# Patient Record
Sex: Female | Born: 1940
Health system: Southern US, Community
[De-identification: ages and names within clinical notes are randomized; demographics above are authoritative.]

## PROBLEM LIST (undated history)

## (undated) DIAGNOSIS — F319 Bipolar disorder, unspecified: Secondary | ICD-10-CM

## (undated) DIAGNOSIS — F0390 Unspecified dementia without behavioral disturbance: Secondary | ICD-10-CM

## (undated) DIAGNOSIS — E669 Obesity, unspecified: Secondary | ICD-10-CM

## (undated) HISTORY — DX: Unspecified dementia, unspecified severity, without behavioral disturbance, psychotic disturbance, mood disturbance, and anxiety: F03.90

## (undated) HISTORY — DX: Obesity, unspecified: E66.9

## (undated) HISTORY — PX: TOTAL HIP ARTHROPLASTY: SHX124

## (undated) HISTORY — DX: Bipolar disorder, unspecified: F31.9

## (undated) HISTORY — PX: OTHER SURGICAL HISTORY: SHX169

---

## 2001-02-01 ENCOUNTER — Encounter: Payer: Self-pay | Admitting: Obstetrics and Gynecology

## 2001-02-07 ENCOUNTER — Inpatient Hospital Stay (HOSPITAL_COMMUNITY): Admission: RE | Admit: 2001-02-07 | Discharge: 2001-02-09 | Payer: Self-pay | Admitting: Obstetrics and Gynecology

## 2014-10-08 DIAGNOSIS — N390 Urinary tract infection, site not specified: Secondary | ICD-10-CM | POA: Diagnosis not present

## 2014-10-12 DIAGNOSIS — N390 Urinary tract infection, site not specified: Secondary | ICD-10-CM | POA: Diagnosis not present

## 2014-10-30 DIAGNOSIS — K219 Gastro-esophageal reflux disease without esophagitis: Secondary | ICD-10-CM | POA: Diagnosis not present

## 2014-10-30 DIAGNOSIS — N189 Chronic kidney disease, unspecified: Secondary | ICD-10-CM | POA: Diagnosis not present

## 2014-10-30 DIAGNOSIS — E538 Deficiency of other specified B group vitamins: Secondary | ICD-10-CM | POA: Diagnosis not present

## 2014-10-30 DIAGNOSIS — I10 Essential (primary) hypertension: Secondary | ICD-10-CM | POA: Diagnosis not present

## 2014-10-30 DIAGNOSIS — Z7901 Long term (current) use of anticoagulants: Secondary | ICD-10-CM | POA: Diagnosis not present

## 2014-10-30 DIAGNOSIS — E785 Hyperlipidemia, unspecified: Secondary | ICD-10-CM | POA: Diagnosis not present

## 2014-11-06 DIAGNOSIS — N390 Urinary tract infection, site not specified: Secondary | ICD-10-CM | POA: Diagnosis not present

## 2014-11-08 DIAGNOSIS — N390 Urinary tract infection, site not specified: Secondary | ICD-10-CM | POA: Diagnosis not present

## 2014-11-12 DIAGNOSIS — I38 Endocarditis, valve unspecified: Secondary | ICD-10-CM | POA: Diagnosis not present

## 2014-11-12 DIAGNOSIS — F419 Anxiety disorder, unspecified: Secondary | ICD-10-CM | POA: Diagnosis not present

## 2014-11-12 DIAGNOSIS — I10 Essential (primary) hypertension: Secondary | ICD-10-CM | POA: Diagnosis not present

## 2014-11-12 DIAGNOSIS — N179 Acute kidney failure, unspecified: Secondary | ICD-10-CM | POA: Diagnosis not present

## 2014-11-12 DIAGNOSIS — G92 Toxic encephalopathy: Secondary | ICD-10-CM | POA: Diagnosis not present

## 2014-11-12 DIAGNOSIS — E780 Pure hypercholesterolemia: Secondary | ICD-10-CM | POA: Diagnosis not present

## 2014-11-12 DIAGNOSIS — E860 Dehydration: Secondary | ICD-10-CM | POA: Diagnosis not present

## 2014-11-12 DIAGNOSIS — R32 Unspecified urinary incontinence: Secondary | ICD-10-CM | POA: Diagnosis not present

## 2014-11-12 DIAGNOSIS — Z90710 Acquired absence of both cervix and uterus: Secondary | ICD-10-CM | POA: Diagnosis not present

## 2014-11-12 DIAGNOSIS — R4702 Dysphasia: Secondary | ICD-10-CM | POA: Diagnosis not present

## 2014-11-12 DIAGNOSIS — Z8701 Personal history of pneumonia (recurrent): Secondary | ICD-10-CM | POA: Diagnosis not present

## 2014-11-12 DIAGNOSIS — R1010 Upper abdominal pain, unspecified: Secondary | ICD-10-CM | POA: Diagnosis not present

## 2014-11-12 DIAGNOSIS — R05 Cough: Secondary | ICD-10-CM | POA: Diagnosis not present

## 2014-11-12 DIAGNOSIS — J90 Pleural effusion, not elsewhere classified: Secondary | ICD-10-CM | POA: Diagnosis not present

## 2014-11-12 DIAGNOSIS — F319 Bipolar disorder, unspecified: Secondary | ICD-10-CM | POA: Diagnosis not present

## 2014-11-12 DIAGNOSIS — G309 Alzheimer's disease, unspecified: Secondary | ICD-10-CM | POA: Diagnosis not present

## 2014-11-12 DIAGNOSIS — R270 Ataxia, unspecified: Secondary | ICD-10-CM | POA: Diagnosis not present

## 2014-11-12 DIAGNOSIS — N261 Atrophy of kidney (terminal): Secondary | ICD-10-CM | POA: Diagnosis not present

## 2014-11-12 DIAGNOSIS — R112 Nausea with vomiting, unspecified: Secondary | ICD-10-CM | POA: Diagnosis not present

## 2014-11-26 DIAGNOSIS — R110 Nausea: Secondary | ICD-10-CM | POA: Diagnosis not present

## 2014-12-10 DIAGNOSIS — I129 Hypertensive chronic kidney disease with stage 1 through stage 4 chronic kidney disease, or unspecified chronic kidney disease: Secondary | ICD-10-CM | POA: Diagnosis not present

## 2014-12-10 DIAGNOSIS — N184 Chronic kidney disease, stage 4 (severe): Secondary | ICD-10-CM | POA: Diagnosis not present

## 2014-12-10 DIAGNOSIS — R32 Unspecified urinary incontinence: Secondary | ICD-10-CM | POA: Diagnosis not present

## 2014-12-25 DIAGNOSIS — N189 Chronic kidney disease, unspecified: Secondary | ICD-10-CM | POA: Diagnosis not present

## 2014-12-25 DIAGNOSIS — G309 Alzheimer's disease, unspecified: Secondary | ICD-10-CM | POA: Diagnosis not present

## 2014-12-25 DIAGNOSIS — I10 Essential (primary) hypertension: Secondary | ICD-10-CM | POA: Diagnosis not present

## 2015-01-22 DIAGNOSIS — N189 Chronic kidney disease, unspecified: Secondary | ICD-10-CM | POA: Diagnosis not present

## 2015-01-22 DIAGNOSIS — G309 Alzheimer's disease, unspecified: Secondary | ICD-10-CM | POA: Diagnosis not present

## 2015-01-22 DIAGNOSIS — I10 Essential (primary) hypertension: Secondary | ICD-10-CM | POA: Diagnosis not present

## 2015-02-01 DIAGNOSIS — H25813 Combined forms of age-related cataract, bilateral: Secondary | ICD-10-CM | POA: Diagnosis not present

## 2015-03-13 DIAGNOSIS — S50811A Abrasion of right forearm, initial encounter: Secondary | ICD-10-CM | POA: Diagnosis not present

## 2015-04-09 DIAGNOSIS — K219 Gastro-esophageal reflux disease without esophagitis: Secondary | ICD-10-CM | POA: Diagnosis not present

## 2015-06-19 DIAGNOSIS — Z23 Encounter for immunization: Secondary | ICD-10-CM | POA: Diagnosis not present

## 2015-06-19 DIAGNOSIS — N183 Chronic kidney disease, stage 3 (moderate): Secondary | ICD-10-CM | POA: Diagnosis not present

## 2015-06-19 DIAGNOSIS — H6122 Impacted cerumen, left ear: Secondary | ICD-10-CM | POA: Diagnosis not present

## 2015-06-19 DIAGNOSIS — J00 Acute nasopharyngitis [common cold]: Secondary | ICD-10-CM | POA: Diagnosis not present

## 2015-06-19 DIAGNOSIS — I10 Essential (primary) hypertension: Secondary | ICD-10-CM | POA: Diagnosis not present

## 2015-07-02 DIAGNOSIS — N318 Other neuromuscular dysfunction of bladder: Secondary | ICD-10-CM | POA: Diagnosis not present

## 2015-07-02 DIAGNOSIS — N3090 Cystitis, unspecified without hematuria: Secondary | ICD-10-CM | POA: Diagnosis not present

## 2015-07-02 DIAGNOSIS — N3000 Acute cystitis without hematuria: Secondary | ICD-10-CM | POA: Diagnosis not present

## 2015-07-02 DIAGNOSIS — R3981 Functional urinary incontinence: Secondary | ICD-10-CM | POA: Diagnosis not present

## 2015-07-23 DIAGNOSIS — N3020 Other chronic cystitis without hematuria: Secondary | ICD-10-CM | POA: Diagnosis not present

## 2015-07-23 DIAGNOSIS — R3981 Functional urinary incontinence: Secondary | ICD-10-CM | POA: Diagnosis not present

## 2015-07-23 DIAGNOSIS — N318 Other neuromuscular dysfunction of bladder: Secondary | ICD-10-CM | POA: Diagnosis not present

## 2015-07-28 DIAGNOSIS — J0100 Acute maxillary sinusitis, unspecified: Secondary | ICD-10-CM | POA: Diagnosis not present

## 2015-09-19 DIAGNOSIS — I10 Essential (primary) hypertension: Secondary | ICD-10-CM | POA: Diagnosis not present

## 2015-09-19 DIAGNOSIS — J00 Acute nasopharyngitis [common cold]: Secondary | ICD-10-CM | POA: Diagnosis not present

## 2015-09-19 DIAGNOSIS — H109 Unspecified conjunctivitis: Secondary | ICD-10-CM | POA: Diagnosis not present

## 2015-09-21 DIAGNOSIS — J189 Pneumonia, unspecified organism: Secondary | ICD-10-CM | POA: Diagnosis not present

## 2015-10-08 DIAGNOSIS — M25552 Pain in left hip: Secondary | ICD-10-CM | POA: Diagnosis not present

## 2015-10-08 DIAGNOSIS — N183 Chronic kidney disease, stage 3 (moderate): Secondary | ICD-10-CM | POA: Diagnosis not present

## 2015-10-08 DIAGNOSIS — M545 Low back pain: Secondary | ICD-10-CM | POA: Diagnosis not present

## 2015-10-08 DIAGNOSIS — M1990 Unspecified osteoarthritis, unspecified site: Secondary | ICD-10-CM | POA: Diagnosis not present

## 2015-10-08 DIAGNOSIS — M25551 Pain in right hip: Secondary | ICD-10-CM | POA: Diagnosis not present

## 2015-10-08 DIAGNOSIS — I10 Essential (primary) hypertension: Secondary | ICD-10-CM | POA: Diagnosis not present

## 2015-10-25 DIAGNOSIS — I5189 Other ill-defined heart diseases: Secondary | ICD-10-CM | POA: Diagnosis not present

## 2015-10-25 DIAGNOSIS — I200 Unstable angina: Secondary | ICD-10-CM | POA: Diagnosis not present

## 2015-10-25 DIAGNOSIS — N179 Acute kidney failure, unspecified: Secondary | ICD-10-CM | POA: Diagnosis not present

## 2015-10-25 DIAGNOSIS — R9431 Abnormal electrocardiogram [ECG] [EKG]: Secondary | ICD-10-CM | POA: Diagnosis not present

## 2015-10-25 DIAGNOSIS — R0602 Shortness of breath: Secondary | ICD-10-CM | POA: Diagnosis not present

## 2015-10-25 DIAGNOSIS — E7800 Pure hypercholesterolemia, unspecified: Secondary | ICD-10-CM | POA: Diagnosis not present

## 2015-10-25 DIAGNOSIS — I129 Hypertensive chronic kidney disease with stage 1 through stage 4 chronic kidney disease, or unspecified chronic kidney disease: Secondary | ICD-10-CM | POA: Diagnosis not present

## 2015-10-25 DIAGNOSIS — N184 Chronic kidney disease, stage 4 (severe): Secondary | ICD-10-CM | POA: Diagnosis not present

## 2015-10-25 DIAGNOSIS — Z79899 Other long term (current) drug therapy: Secondary | ICD-10-CM | POA: Diagnosis not present

## 2015-10-25 DIAGNOSIS — G309 Alzheimer's disease, unspecified: Secondary | ICD-10-CM | POA: Diagnosis not present

## 2015-10-25 DIAGNOSIS — R079 Chest pain, unspecified: Secondary | ICD-10-CM | POA: Diagnosis not present

## 2015-10-25 DIAGNOSIS — R51 Headache: Secondary | ICD-10-CM | POA: Diagnosis not present

## 2015-10-25 DIAGNOSIS — G92 Toxic encephalopathy: Secondary | ICD-10-CM | POA: Diagnosis not present

## 2015-10-25 DIAGNOSIS — I10 Essential (primary) hypertension: Secondary | ICD-10-CM | POA: Diagnosis not present

## 2015-10-25 DIAGNOSIS — K219 Gastro-esophageal reflux disease without esophagitis: Secondary | ICD-10-CM | POA: Diagnosis not present

## 2015-10-31 DIAGNOSIS — N2889 Other specified disorders of kidney and ureter: Secondary | ICD-10-CM | POA: Diagnosis not present

## 2015-10-31 DIAGNOSIS — D72829 Elevated white blood cell count, unspecified: Secondary | ICD-10-CM | POA: Diagnosis not present

## 2015-10-31 DIAGNOSIS — R112 Nausea with vomiting, unspecified: Secondary | ICD-10-CM | POA: Diagnosis not present

## 2015-11-03 DIAGNOSIS — N390 Urinary tract infection, site not specified: Secondary | ICD-10-CM | POA: Diagnosis not present

## 2015-11-03 DIAGNOSIS — R279 Unspecified lack of coordination: Secondary | ICD-10-CM | POA: Diagnosis not present

## 2015-11-03 DIAGNOSIS — E872 Acidosis: Secondary | ICD-10-CM | POA: Diagnosis not present

## 2015-11-03 DIAGNOSIS — N179 Acute kidney failure, unspecified: Secondary | ICD-10-CM | POA: Diagnosis not present

## 2015-11-03 DIAGNOSIS — R1312 Dysphagia, oropharyngeal phase: Secondary | ICD-10-CM | POA: Diagnosis not present

## 2015-11-03 DIAGNOSIS — I10 Essential (primary) hypertension: Secondary | ICD-10-CM | POA: Diagnosis not present

## 2015-11-03 DIAGNOSIS — K264 Chronic or unspecified duodenal ulcer with hemorrhage: Secondary | ICD-10-CM | POA: Diagnosis not present

## 2015-11-03 DIAGNOSIS — K219 Gastro-esophageal reflux disease without esophagitis: Secondary | ICD-10-CM | POA: Diagnosis not present

## 2015-11-03 DIAGNOSIS — R1110 Vomiting, unspecified: Secondary | ICD-10-CM | POA: Diagnosis not present

## 2015-11-03 DIAGNOSIS — R633 Feeding difficulties: Secondary | ICD-10-CM | POA: Diagnosis not present

## 2015-11-03 DIAGNOSIS — B952 Enterococcus as the cause of diseases classified elsewhere: Secondary | ICD-10-CM | POA: Diagnosis not present

## 2015-11-03 DIAGNOSIS — M1990 Unspecified osteoarthritis, unspecified site: Secondary | ICD-10-CM | POA: Diagnosis not present

## 2015-11-03 DIAGNOSIS — R0602 Shortness of breath: Secondary | ICD-10-CM | POA: Diagnosis not present

## 2015-11-03 DIAGNOSIS — Z1624 Resistance to multiple antibiotics: Secondary | ICD-10-CM | POA: Diagnosis not present

## 2015-11-03 DIAGNOSIS — I38 Endocarditis, valve unspecified: Secondary | ICD-10-CM | POA: Diagnosis not present

## 2015-11-03 DIAGNOSIS — G9341 Metabolic encephalopathy: Secondary | ICD-10-CM | POA: Diagnosis not present

## 2015-11-03 DIAGNOSIS — K922 Gastrointestinal hemorrhage, unspecified: Secondary | ICD-10-CM | POA: Diagnosis not present

## 2015-11-03 DIAGNOSIS — R262 Difficulty in walking, not elsewhere classified: Secondary | ICD-10-CM | POA: Diagnosis not present

## 2015-11-03 DIAGNOSIS — N189 Chronic kidney disease, unspecified: Secondary | ICD-10-CM | POA: Diagnosis not present

## 2015-11-03 DIAGNOSIS — T8119XA Other postprocedural shock, initial encounter: Secondary | ICD-10-CM | POA: Diagnosis not present

## 2015-11-03 DIAGNOSIS — D62 Acute posthemorrhagic anemia: Secondary | ICD-10-CM | POA: Diagnosis not present

## 2015-11-03 DIAGNOSIS — K260 Acute duodenal ulcer with hemorrhage: Secondary | ICD-10-CM | POA: Diagnosis not present

## 2015-11-09 DIAGNOSIS — I10 Essential (primary) hypertension: Secondary | ICD-10-CM | POA: Diagnosis not present

## 2015-11-09 DIAGNOSIS — R633 Feeding difficulties: Secondary | ICD-10-CM | POA: Diagnosis not present

## 2015-11-09 DIAGNOSIS — I38 Endocarditis, valve unspecified: Secondary | ICD-10-CM | POA: Diagnosis not present

## 2015-11-09 DIAGNOSIS — N390 Urinary tract infection, site not specified: Secondary | ICD-10-CM | POA: Diagnosis not present

## 2015-11-09 DIAGNOSIS — R1312 Dysphagia, oropharyngeal phase: Secondary | ICD-10-CM | POA: Diagnosis not present

## 2015-11-09 DIAGNOSIS — B952 Enterococcus as the cause of diseases classified elsewhere: Secondary | ICD-10-CM | POA: Diagnosis not present

## 2015-11-09 DIAGNOSIS — Z1624 Resistance to multiple antibiotics: Secondary | ICD-10-CM | POA: Diagnosis not present

## 2015-11-09 DIAGNOSIS — K264 Chronic or unspecified duodenal ulcer with hemorrhage: Secondary | ICD-10-CM | POA: Diagnosis not present

## 2015-11-09 DIAGNOSIS — N179 Acute kidney failure, unspecified: Secondary | ICD-10-CM | POA: Diagnosis not present

## 2015-11-09 DIAGNOSIS — R279 Unspecified lack of coordination: Secondary | ICD-10-CM | POA: Diagnosis not present

## 2015-11-09 DIAGNOSIS — D62 Acute posthemorrhagic anemia: Secondary | ICD-10-CM | POA: Diagnosis not present

## 2015-11-09 DIAGNOSIS — M1990 Unspecified osteoarthritis, unspecified site: Secondary | ICD-10-CM | POA: Diagnosis not present

## 2015-11-09 DIAGNOSIS — K219 Gastro-esophageal reflux disease without esophagitis: Secondary | ICD-10-CM | POA: Diagnosis not present

## 2015-11-09 DIAGNOSIS — R262 Difficulty in walking, not elsewhere classified: Secondary | ICD-10-CM | POA: Diagnosis not present

## 2015-11-09 DIAGNOSIS — N189 Chronic kidney disease, unspecified: Secondary | ICD-10-CM | POA: Diagnosis not present

## 2015-11-09 DIAGNOSIS — K922 Gastrointestinal hemorrhage, unspecified: Secondary | ICD-10-CM | POA: Diagnosis not present

## 2015-11-12 DIAGNOSIS — D62 Acute posthemorrhagic anemia: Secondary | ICD-10-CM | POA: Diagnosis not present

## 2015-11-12 DIAGNOSIS — N390 Urinary tract infection, site not specified: Secondary | ICD-10-CM | POA: Diagnosis not present

## 2015-11-17 DIAGNOSIS — I10 Essential (primary) hypertension: Secondary | ICD-10-CM | POA: Diagnosis not present

## 2015-11-17 DIAGNOSIS — K648 Other hemorrhoids: Secondary | ICD-10-CM | POA: Diagnosis not present

## 2015-11-17 DIAGNOSIS — K922 Gastrointestinal hemorrhage, unspecified: Secondary | ICD-10-CM | POA: Diagnosis not present

## 2015-11-17 DIAGNOSIS — D62 Acute posthemorrhagic anemia: Secondary | ICD-10-CM | POA: Diagnosis not present

## 2015-11-17 DIAGNOSIS — N179 Acute kidney failure, unspecified: Secondary | ICD-10-CM | POA: Diagnosis not present

## 2015-11-17 DIAGNOSIS — K264 Chronic or unspecified duodenal ulcer with hemorrhage: Secondary | ICD-10-CM | POA: Diagnosis not present

## 2015-11-22 DIAGNOSIS — K264 Chronic or unspecified duodenal ulcer with hemorrhage: Secondary | ICD-10-CM | POA: Diagnosis not present

## 2015-11-25 DIAGNOSIS — R404 Transient alteration of awareness: Secondary | ICD-10-CM | POA: Diagnosis not present

## 2015-11-25 DIAGNOSIS — R4182 Altered mental status, unspecified: Secondary | ICD-10-CM | POA: Diagnosis not present

## 2015-11-25 DIAGNOSIS — R531 Weakness: Secondary | ICD-10-CM | POA: Diagnosis not present

## 2015-11-26 DIAGNOSIS — N183 Chronic kidney disease, stage 3 (moderate): Secondary | ICD-10-CM | POA: Diagnosis not present

## 2015-11-26 DIAGNOSIS — R109 Unspecified abdominal pain: Secondary | ICD-10-CM | POA: Diagnosis not present

## 2015-11-26 DIAGNOSIS — I10 Essential (primary) hypertension: Secondary | ICD-10-CM | POA: Diagnosis not present

## 2015-11-26 DIAGNOSIS — D649 Anemia, unspecified: Secondary | ICD-10-CM | POA: Diagnosis not present

## 2015-11-27 DIAGNOSIS — K264 Chronic or unspecified duodenal ulcer with hemorrhage: Secondary | ICD-10-CM | POA: Diagnosis not present

## 2015-11-28 DIAGNOSIS — K264 Chronic or unspecified duodenal ulcer with hemorrhage: Secondary | ICD-10-CM | POA: Diagnosis not present

## 2015-12-03 DIAGNOSIS — K264 Chronic or unspecified duodenal ulcer with hemorrhage: Secondary | ICD-10-CM | POA: Diagnosis not present

## 2015-12-04 DIAGNOSIS — K264 Chronic or unspecified duodenal ulcer with hemorrhage: Secondary | ICD-10-CM | POA: Diagnosis not present

## 2015-12-05 DIAGNOSIS — K264 Chronic or unspecified duodenal ulcer with hemorrhage: Secondary | ICD-10-CM | POA: Diagnosis not present

## 2015-12-09 DIAGNOSIS — K264 Chronic or unspecified duodenal ulcer with hemorrhage: Secondary | ICD-10-CM | POA: Diagnosis not present

## 2015-12-11 DIAGNOSIS — K264 Chronic or unspecified duodenal ulcer with hemorrhage: Secondary | ICD-10-CM | POA: Diagnosis not present

## 2015-12-12 DIAGNOSIS — K264 Chronic or unspecified duodenal ulcer with hemorrhage: Secondary | ICD-10-CM | POA: Diagnosis not present

## 2015-12-17 DIAGNOSIS — K264 Chronic or unspecified duodenal ulcer with hemorrhage: Secondary | ICD-10-CM | POA: Diagnosis not present

## 2015-12-18 DIAGNOSIS — K264 Chronic or unspecified duodenal ulcer with hemorrhage: Secondary | ICD-10-CM | POA: Diagnosis not present

## 2015-12-19 DIAGNOSIS — E119 Type 2 diabetes mellitus without complications: Secondary | ICD-10-CM | POA: Diagnosis not present

## 2015-12-19 DIAGNOSIS — E559 Vitamin D deficiency, unspecified: Secondary | ICD-10-CM | POA: Diagnosis not present

## 2015-12-19 DIAGNOSIS — R5383 Other fatigue: Secondary | ICD-10-CM | POA: Diagnosis not present

## 2015-12-19 DIAGNOSIS — R799 Abnormal finding of blood chemistry, unspecified: Secondary | ICD-10-CM | POA: Diagnosis not present

## 2015-12-19 DIAGNOSIS — I10 Essential (primary) hypertension: Secondary | ICD-10-CM | POA: Diagnosis not present

## 2015-12-19 DIAGNOSIS — E785 Hyperlipidemia, unspecified: Secondary | ICD-10-CM | POA: Diagnosis not present

## 2015-12-19 DIAGNOSIS — N183 Chronic kidney disease, stage 3 (moderate): Secondary | ICD-10-CM | POA: Diagnosis not present

## 2015-12-19 DIAGNOSIS — Z0000 Encounter for general adult medical examination without abnormal findings: Secondary | ICD-10-CM | POA: Diagnosis not present

## 2015-12-26 DIAGNOSIS — K264 Chronic or unspecified duodenal ulcer with hemorrhage: Secondary | ICD-10-CM | POA: Diagnosis not present

## 2015-12-31 DIAGNOSIS — N189 Chronic kidney disease, unspecified: Secondary | ICD-10-CM | POA: Diagnosis not present

## 2015-12-31 DIAGNOSIS — D649 Anemia, unspecified: Secondary | ICD-10-CM | POA: Diagnosis not present

## 2015-12-31 DIAGNOSIS — E860 Dehydration: Secondary | ICD-10-CM | POA: Diagnosis not present

## 2015-12-31 DIAGNOSIS — N390 Urinary tract infection, site not specified: Secondary | ICD-10-CM | POA: Diagnosis not present

## 2016-03-30 DIAGNOSIS — M79604 Pain in right leg: Secondary | ICD-10-CM | POA: Diagnosis not present

## 2016-04-06 DIAGNOSIS — J209 Acute bronchitis, unspecified: Secondary | ICD-10-CM | POA: Diagnosis not present

## 2016-04-09 DIAGNOSIS — R062 Wheezing: Secondary | ICD-10-CM | POA: Diagnosis not present

## 2016-04-09 DIAGNOSIS — J209 Acute bronchitis, unspecified: Secondary | ICD-10-CM | POA: Diagnosis not present

## 2016-06-19 DIAGNOSIS — N390 Urinary tract infection, site not specified: Secondary | ICD-10-CM | POA: Diagnosis not present

## 2016-06-19 DIAGNOSIS — M545 Low back pain: Secondary | ICD-10-CM | POA: Diagnosis not present

## 2016-06-19 DIAGNOSIS — R109 Unspecified abdominal pain: Secondary | ICD-10-CM | POA: Diagnosis not present

## 2016-06-30 DIAGNOSIS — D649 Anemia, unspecified: Secondary | ICD-10-CM | POA: Diagnosis not present

## 2016-07-24 DIAGNOSIS — E889 Metabolic disorder, unspecified: Secondary | ICD-10-CM | POA: Diagnosis not present

## 2016-07-24 DIAGNOSIS — N119 Chronic tubulo-interstitial nephritis, unspecified: Secondary | ICD-10-CM | POA: Diagnosis not present

## 2016-07-24 DIAGNOSIS — E559 Vitamin D deficiency, unspecified: Secondary | ICD-10-CM | POA: Diagnosis not present

## 2016-07-24 DIAGNOSIS — N184 Chronic kidney disease, stage 4 (severe): Secondary | ICD-10-CM | POA: Diagnosis not present

## 2016-08-18 DIAGNOSIS — R05 Cough: Secondary | ICD-10-CM | POA: Diagnosis not present

## 2016-10-12 DIAGNOSIS — N289 Disorder of kidney and ureter, unspecified: Secondary | ICD-10-CM | POA: Diagnosis not present

## 2016-10-12 DIAGNOSIS — G9341 Metabolic encephalopathy: Secondary | ICD-10-CM | POA: Diagnosis not present

## 2016-10-12 DIAGNOSIS — R531 Weakness: Secondary | ICD-10-CM | POA: Diagnosis not present

## 2016-10-12 DIAGNOSIS — R404 Transient alteration of awareness: Secondary | ICD-10-CM | POA: Diagnosis not present

## 2016-10-12 DIAGNOSIS — A419 Sepsis, unspecified organism: Secondary | ICD-10-CM | POA: Diagnosis not present

## 2016-10-13 DIAGNOSIS — G9341 Metabolic encephalopathy: Secondary | ICD-10-CM | POA: Diagnosis not present

## 2016-10-13 DIAGNOSIS — J111 Influenza due to unidentified influenza virus with other respiratory manifestations: Secondary | ICD-10-CM | POA: Diagnosis not present

## 2016-10-13 DIAGNOSIS — N179 Acute kidney failure, unspecified: Secondary | ICD-10-CM | POA: Diagnosis not present

## 2016-10-13 DIAGNOSIS — F0390 Unspecified dementia without behavioral disturbance: Secondary | ICD-10-CM | POA: Diagnosis not present

## 2016-10-14 DIAGNOSIS — N179 Acute kidney failure, unspecified: Secondary | ICD-10-CM | POA: Diagnosis not present

## 2016-10-14 DIAGNOSIS — G9341 Metabolic encephalopathy: Secondary | ICD-10-CM | POA: Diagnosis not present

## 2016-10-14 DIAGNOSIS — J111 Influenza due to unidentified influenza virus with other respiratory manifestations: Secondary | ICD-10-CM | POA: Diagnosis not present

## 2016-10-14 DIAGNOSIS — F0390 Unspecified dementia without behavioral disturbance: Secondary | ICD-10-CM | POA: Diagnosis not present

## 2016-10-16 DIAGNOSIS — R05 Cough: Secondary | ICD-10-CM | POA: Diagnosis not present

## 2016-10-19 DIAGNOSIS — N183 Chronic kidney disease, stage 3 (moderate): Secondary | ICD-10-CM | POA: Diagnosis not present

## 2016-10-19 DIAGNOSIS — I129 Hypertensive chronic kidney disease with stage 1 through stage 4 chronic kidney disease, or unspecified chronic kidney disease: Secondary | ICD-10-CM | POA: Diagnosis not present

## 2016-10-19 DIAGNOSIS — Z87440 Personal history of urinary (tract) infections: Secondary | ICD-10-CM | POA: Diagnosis not present

## 2016-10-19 DIAGNOSIS — K264 Chronic or unspecified duodenal ulcer with hemorrhage: Secondary | ICD-10-CM | POA: Diagnosis not present

## 2016-10-19 DIAGNOSIS — I69391 Dysphagia following cerebral infarction: Secondary | ICD-10-CM | POA: Diagnosis not present

## 2016-10-19 DIAGNOSIS — M1990 Unspecified osteoarthritis, unspecified site: Secondary | ICD-10-CM | POA: Diagnosis not present

## 2016-10-19 DIAGNOSIS — N179 Acute kidney failure, unspecified: Secondary | ICD-10-CM | POA: Diagnosis not present

## 2016-10-19 DIAGNOSIS — R1310 Dysphagia, unspecified: Secondary | ICD-10-CM | POA: Diagnosis not present

## 2016-10-19 DIAGNOSIS — I10 Essential (primary) hypertension: Secondary | ICD-10-CM | POA: Diagnosis not present

## 2016-10-19 DIAGNOSIS — K219 Gastro-esophageal reflux disease without esophagitis: Secondary | ICD-10-CM | POA: Diagnosis not present

## 2016-10-19 DIAGNOSIS — D649 Anemia, unspecified: Secondary | ICD-10-CM | POA: Diagnosis not present

## 2016-10-19 DIAGNOSIS — K922 Gastrointestinal hemorrhage, unspecified: Secondary | ICD-10-CM | POA: Diagnosis not present

## 2016-10-20 DIAGNOSIS — R05 Cough: Secondary | ICD-10-CM | POA: Diagnosis not present

## 2016-10-21 DIAGNOSIS — I69391 Dysphagia following cerebral infarction: Secondary | ICD-10-CM | POA: Diagnosis not present

## 2016-10-21 DIAGNOSIS — K922 Gastrointestinal hemorrhage, unspecified: Secondary | ICD-10-CM | POA: Diagnosis not present

## 2016-10-21 DIAGNOSIS — D649 Anemia, unspecified: Secondary | ICD-10-CM | POA: Diagnosis not present

## 2016-10-21 DIAGNOSIS — Z87440 Personal history of urinary (tract) infections: Secondary | ICD-10-CM | POA: Diagnosis not present

## 2016-10-21 DIAGNOSIS — I10 Essential (primary) hypertension: Secondary | ICD-10-CM | POA: Diagnosis not present

## 2016-10-21 DIAGNOSIS — R1310 Dysphagia, unspecified: Secondary | ICD-10-CM | POA: Diagnosis not present

## 2016-10-21 DIAGNOSIS — M1990 Unspecified osteoarthritis, unspecified site: Secondary | ICD-10-CM | POA: Diagnosis not present

## 2016-10-21 DIAGNOSIS — N179 Acute kidney failure, unspecified: Secondary | ICD-10-CM | POA: Diagnosis not present

## 2016-10-21 DIAGNOSIS — K219 Gastro-esophageal reflux disease without esophagitis: Secondary | ICD-10-CM | POA: Diagnosis not present

## 2016-10-21 DIAGNOSIS — I129 Hypertensive chronic kidney disease with stage 1 through stage 4 chronic kidney disease, or unspecified chronic kidney disease: Secondary | ICD-10-CM | POA: Diagnosis not present

## 2016-10-21 DIAGNOSIS — K264 Chronic or unspecified duodenal ulcer with hemorrhage: Secondary | ICD-10-CM | POA: Diagnosis not present

## 2016-10-21 DIAGNOSIS — N183 Chronic kidney disease, stage 3 (moderate): Secondary | ICD-10-CM | POA: Diagnosis not present

## 2016-11-23 DIAGNOSIS — N119 Chronic tubulo-interstitial nephritis, unspecified: Secondary | ICD-10-CM | POA: Diagnosis not present

## 2016-11-23 DIAGNOSIS — M9080 Osteopathy in diseases classified elsewhere, unspecified site: Secondary | ICD-10-CM | POA: Diagnosis not present

## 2016-11-23 DIAGNOSIS — E559 Vitamin D deficiency, unspecified: Secondary | ICD-10-CM | POA: Diagnosis not present

## 2016-11-23 DIAGNOSIS — N184 Chronic kidney disease, stage 4 (severe): Secondary | ICD-10-CM | POA: Diagnosis not present

## 2016-11-23 DIAGNOSIS — E889 Metabolic disorder, unspecified: Secondary | ICD-10-CM | POA: Diagnosis not present

## 2017-02-11 DIAGNOSIS — G309 Alzheimer's disease, unspecified: Secondary | ICD-10-CM | POA: Diagnosis not present

## 2017-02-11 DIAGNOSIS — I129 Hypertensive chronic kidney disease with stage 1 through stage 4 chronic kidney disease, or unspecified chronic kidney disease: Secondary | ICD-10-CM | POA: Diagnosis not present

## 2017-02-11 DIAGNOSIS — N189 Chronic kidney disease, unspecified: Secondary | ICD-10-CM | POA: Diagnosis not present

## 2017-02-11 DIAGNOSIS — M25551 Pain in right hip: Secondary | ICD-10-CM | POA: Diagnosis not present

## 2017-02-11 DIAGNOSIS — M25552 Pain in left hip: Secondary | ICD-10-CM | POA: Diagnosis not present

## 2017-02-11 DIAGNOSIS — S3993XA Unspecified injury of pelvis, initial encounter: Secondary | ICD-10-CM | POA: Diagnosis not present

## 2017-02-16 DIAGNOSIS — M25569 Pain in unspecified knee: Secondary | ICD-10-CM | POA: Diagnosis not present

## 2017-02-16 DIAGNOSIS — I10 Essential (primary) hypertension: Secondary | ICD-10-CM | POA: Diagnosis not present

## 2017-08-10 DIAGNOSIS — I10 Essential (primary) hypertension: Secondary | ICD-10-CM | POA: Diagnosis not present

## 2017-08-11 DIAGNOSIS — A419 Sepsis, unspecified organism: Secondary | ICD-10-CM | POA: Diagnosis not present

## 2017-08-11 DIAGNOSIS — S92902A Unspecified fracture of left foot, initial encounter for closed fracture: Secondary | ICD-10-CM | POA: Diagnosis not present

## 2017-08-11 DIAGNOSIS — M79672 Pain in left foot: Secondary | ICD-10-CM | POA: Diagnosis not present

## 2017-08-12 DIAGNOSIS — A419 Sepsis, unspecified organism: Secondary | ICD-10-CM | POA: Diagnosis not present

## 2017-08-12 DIAGNOSIS — M79672 Pain in left foot: Secondary | ICD-10-CM | POA: Diagnosis not present

## 2017-08-12 DIAGNOSIS — S92902A Unspecified fracture of left foot, initial encounter for closed fracture: Secondary | ICD-10-CM | POA: Diagnosis not present

## 2017-08-13 DIAGNOSIS — R05 Cough: Secondary | ICD-10-CM | POA: Diagnosis not present

## 2017-08-17 DIAGNOSIS — G4700 Insomnia, unspecified: Secondary | ICD-10-CM | POA: Diagnosis not present

## 2017-08-19 DIAGNOSIS — M84375A Stress fracture, left foot, initial encounter for fracture: Secondary | ICD-10-CM | POA: Diagnosis not present

## 2017-08-24 DIAGNOSIS — I10 Essential (primary) hypertension: Secondary | ICD-10-CM | POA: Diagnosis not present

## 2017-08-24 DIAGNOSIS — R251 Tremor, unspecified: Secondary | ICD-10-CM | POA: Diagnosis not present

## 2017-08-26 DIAGNOSIS — I10 Essential (primary) hypertension: Secondary | ICD-10-CM | POA: Diagnosis not present

## 2017-08-26 DIAGNOSIS — E785 Hyperlipidemia, unspecified: Secondary | ICD-10-CM | POA: Diagnosis not present

## 2017-09-01 DIAGNOSIS — S79911A Unspecified injury of right hip, initial encounter: Secondary | ICD-10-CM | POA: Diagnosis not present

## 2017-09-01 DIAGNOSIS — N189 Chronic kidney disease, unspecified: Secondary | ICD-10-CM | POA: Diagnosis not present

## 2017-09-01 DIAGNOSIS — S72012A Unspecified intracapsular fracture of left femur, initial encounter for closed fracture: Secondary | ICD-10-CM | POA: Diagnosis not present

## 2017-09-01 DIAGNOSIS — S79922A Unspecified injury of left thigh, initial encounter: Secondary | ICD-10-CM | POA: Diagnosis not present

## 2017-09-01 DIAGNOSIS — M79605 Pain in left leg: Secondary | ICD-10-CM | POA: Diagnosis not present

## 2017-09-01 DIAGNOSIS — W19XXXA Unspecified fall, initial encounter: Secondary | ICD-10-CM | POA: Diagnosis not present

## 2017-09-01 DIAGNOSIS — F319 Bipolar disorder, unspecified: Secondary | ICD-10-CM | POA: Diagnosis not present

## 2017-09-01 DIAGNOSIS — T148XXA Other injury of unspecified body region, initial encounter: Secondary | ICD-10-CM | POA: Diagnosis not present

## 2017-09-01 DIAGNOSIS — F0390 Unspecified dementia without behavioral disturbance: Secondary | ICD-10-CM | POA: Diagnosis not present

## 2017-09-01 DIAGNOSIS — M25551 Pain in right hip: Secondary | ICD-10-CM | POA: Diagnosis not present

## 2017-09-01 DIAGNOSIS — S79912A Unspecified injury of left hip, initial encounter: Secondary | ICD-10-CM | POA: Diagnosis not present

## 2017-09-01 DIAGNOSIS — S299XXA Unspecified injury of thorax, initial encounter: Secondary | ICD-10-CM | POA: Diagnosis not present

## 2017-09-01 DIAGNOSIS — M25552 Pain in left hip: Secondary | ICD-10-CM | POA: Diagnosis not present

## 2017-09-02 DIAGNOSIS — K219 Gastro-esophageal reflux disease without esophagitis: Secondary | ICD-10-CM | POA: Diagnosis not present

## 2017-09-02 DIAGNOSIS — F0390 Unspecified dementia without behavioral disturbance: Secondary | ICD-10-CM | POA: Diagnosis not present

## 2017-09-02 DIAGNOSIS — S72002A Fracture of unspecified part of neck of left femur, initial encounter for closed fracture: Secondary | ICD-10-CM | POA: Diagnosis not present

## 2017-09-02 DIAGNOSIS — E870 Hyperosmolality and hypernatremia: Secondary | ICD-10-CM | POA: Diagnosis not present

## 2017-09-02 DIAGNOSIS — I129 Hypertensive chronic kidney disease with stage 1 through stage 4 chronic kidney disease, or unspecified chronic kidney disease: Secondary | ICD-10-CM | POA: Diagnosis not present

## 2017-09-02 DIAGNOSIS — S72042A Displaced fracture of base of neck of left femur, initial encounter for closed fracture: Secondary | ICD-10-CM | POA: Diagnosis not present

## 2017-09-02 DIAGNOSIS — S79911A Unspecified injury of right hip, initial encounter: Secondary | ICD-10-CM | POA: Diagnosis not present

## 2017-09-02 DIAGNOSIS — N179 Acute kidney failure, unspecified: Secondary | ICD-10-CM | POA: Diagnosis not present

## 2017-09-02 DIAGNOSIS — Z66 Do not resuscitate: Secondary | ICD-10-CM | POA: Diagnosis not present

## 2017-09-02 DIAGNOSIS — R1312 Dysphagia, oropharyngeal phase: Secondary | ICD-10-CM | POA: Diagnosis not present

## 2017-09-02 DIAGNOSIS — I10 Essential (primary) hypertension: Secondary | ICD-10-CM | POA: Diagnosis not present

## 2017-09-02 DIAGNOSIS — R130 Aphagia: Secondary | ICD-10-CM | POA: Diagnosis not present

## 2017-09-02 DIAGNOSIS — Z9181 History of falling: Secondary | ICD-10-CM | POA: Diagnosis not present

## 2017-09-02 DIAGNOSIS — S79912A Unspecified injury of left hip, initial encounter: Secondary | ICD-10-CM | POA: Diagnosis not present

## 2017-09-02 DIAGNOSIS — E7800 Pure hypercholesterolemia, unspecified: Secondary | ICD-10-CM | POA: Diagnosis not present

## 2017-09-02 DIAGNOSIS — N183 Chronic kidney disease, stage 3 (moderate): Secondary | ICD-10-CM | POA: Diagnosis not present

## 2017-09-02 DIAGNOSIS — R278 Other lack of coordination: Secondary | ICD-10-CM | POA: Diagnosis not present

## 2017-09-02 DIAGNOSIS — D631 Anemia in chronic kidney disease: Secondary | ICD-10-CM | POA: Diagnosis not present

## 2017-09-02 DIAGNOSIS — S72009A Fracture of unspecified part of neck of unspecified femur, initial encounter for closed fracture: Secondary | ICD-10-CM | POA: Diagnosis not present

## 2017-09-02 DIAGNOSIS — R2689 Other abnormalities of gait and mobility: Secondary | ICD-10-CM | POA: Diagnosis not present

## 2017-09-02 DIAGNOSIS — M25551 Pain in right hip: Secondary | ICD-10-CM | POA: Diagnosis not present

## 2017-09-02 DIAGNOSIS — F319 Bipolar disorder, unspecified: Secondary | ICD-10-CM | POA: Diagnosis not present

## 2017-09-02 DIAGNOSIS — Z79899 Other long term (current) drug therapy: Secondary | ICD-10-CM | POA: Diagnosis not present

## 2017-09-02 DIAGNOSIS — M25552 Pain in left hip: Secondary | ICD-10-CM | POA: Diagnosis not present

## 2017-09-02 DIAGNOSIS — R5383 Other fatigue: Secondary | ICD-10-CM | POA: Diagnosis not present

## 2017-09-02 DIAGNOSIS — W19XXXA Unspecified fall, initial encounter: Secondary | ICD-10-CM | POA: Diagnosis not present

## 2017-09-02 DIAGNOSIS — S72092A Other fracture of head and neck of left femur, initial encounter for closed fracture: Secondary | ICD-10-CM | POA: Diagnosis not present

## 2017-09-02 DIAGNOSIS — R2681 Unsteadiness on feet: Secondary | ICD-10-CM | POA: Diagnosis not present

## 2017-09-02 DIAGNOSIS — M6281 Muscle weakness (generalized): Secondary | ICD-10-CM | POA: Diagnosis not present

## 2017-09-02 DIAGNOSIS — N189 Chronic kidney disease, unspecified: Secondary | ICD-10-CM | POA: Diagnosis not present

## 2017-09-02 DIAGNOSIS — S72012A Unspecified intracapsular fracture of left femur, initial encounter for closed fracture: Secondary | ICD-10-CM | POA: Diagnosis not present

## 2017-09-02 DIAGNOSIS — G20 Parkinson's disease: Secondary | ICD-10-CM | POA: Diagnosis not present

## 2017-09-02 DIAGNOSIS — S299XXA Unspecified injury of thorax, initial encounter: Secondary | ICD-10-CM | POA: Diagnosis not present

## 2017-09-02 DIAGNOSIS — S79922A Unspecified injury of left thigh, initial encounter: Secondary | ICD-10-CM | POA: Diagnosis not present

## 2017-09-02 DIAGNOSIS — S72092D Other fracture of head and neck of left femur, subsequent encounter for closed fracture with routine healing: Secondary | ICD-10-CM | POA: Diagnosis not present

## 2017-09-02 DIAGNOSIS — E871 Hypo-osmolality and hyponatremia: Secondary | ICD-10-CM | POA: Diagnosis not present

## 2017-09-02 DIAGNOSIS — E8770 Fluid overload, unspecified: Secondary | ICD-10-CM | POA: Diagnosis not present

## 2017-09-02 DIAGNOSIS — R5381 Other malaise: Secondary | ICD-10-CM | POA: Diagnosis not present

## 2017-09-02 DIAGNOSIS — Z7982 Long term (current) use of aspirin: Secondary | ICD-10-CM | POA: Diagnosis not present

## 2017-09-03 DIAGNOSIS — S72012A Unspecified intracapsular fracture of left femur, initial encounter for closed fracture: Secondary | ICD-10-CM | POA: Diagnosis not present

## 2017-09-03 DIAGNOSIS — W19XXXA Unspecified fall, initial encounter: Secondary | ICD-10-CM | POA: Diagnosis not present

## 2017-09-03 DIAGNOSIS — F319 Bipolar disorder, unspecified: Secondary | ICD-10-CM | POA: Diagnosis not present

## 2017-09-03 DIAGNOSIS — N189 Chronic kidney disease, unspecified: Secondary | ICD-10-CM | POA: Diagnosis not present

## 2017-09-04 DIAGNOSIS — N189 Chronic kidney disease, unspecified: Secondary | ICD-10-CM | POA: Diagnosis not present

## 2017-09-04 DIAGNOSIS — F319 Bipolar disorder, unspecified: Secondary | ICD-10-CM | POA: Diagnosis not present

## 2017-09-04 DIAGNOSIS — W19XXXA Unspecified fall, initial encounter: Secondary | ICD-10-CM | POA: Diagnosis not present

## 2017-09-04 DIAGNOSIS — S72012A Unspecified intracapsular fracture of left femur, initial encounter for closed fracture: Secondary | ICD-10-CM | POA: Diagnosis not present

## 2017-09-05 DIAGNOSIS — F319 Bipolar disorder, unspecified: Secondary | ICD-10-CM | POA: Diagnosis not present

## 2017-09-05 DIAGNOSIS — W19XXXA Unspecified fall, initial encounter: Secondary | ICD-10-CM | POA: Diagnosis not present

## 2017-09-05 DIAGNOSIS — S72012A Unspecified intracapsular fracture of left femur, initial encounter for closed fracture: Secondary | ICD-10-CM | POA: Diagnosis not present

## 2017-09-05 DIAGNOSIS — N189 Chronic kidney disease, unspecified: Secondary | ICD-10-CM | POA: Diagnosis not present

## 2017-09-06 DIAGNOSIS — I10 Essential (primary) hypertension: Secondary | ICD-10-CM | POA: Diagnosis not present

## 2017-09-06 DIAGNOSIS — S72009A Fracture of unspecified part of neck of unspecified femur, initial encounter for closed fracture: Secondary | ICD-10-CM | POA: Diagnosis not present

## 2017-09-06 DIAGNOSIS — R1312 Dysphagia, oropharyngeal phase: Secondary | ICD-10-CM | POA: Diagnosis not present

## 2017-09-06 DIAGNOSIS — S72002A Fracture of unspecified part of neck of left femur, initial encounter for closed fracture: Secondary | ICD-10-CM | POA: Diagnosis not present

## 2017-09-06 DIAGNOSIS — R2681 Unsteadiness on feet: Secondary | ICD-10-CM | POA: Diagnosis not present

## 2017-09-06 DIAGNOSIS — F319 Bipolar disorder, unspecified: Secondary | ICD-10-CM | POA: Diagnosis not present

## 2017-09-06 DIAGNOSIS — N189 Chronic kidney disease, unspecified: Secondary | ICD-10-CM | POA: Diagnosis not present

## 2017-09-06 DIAGNOSIS — Z79899 Other long term (current) drug therapy: Secondary | ICD-10-CM | POA: Diagnosis not present

## 2017-09-06 DIAGNOSIS — R278 Other lack of coordination: Secondary | ICD-10-CM | POA: Diagnosis not present

## 2017-09-06 DIAGNOSIS — S72012A Unspecified intracapsular fracture of left femur, initial encounter for closed fracture: Secondary | ICD-10-CM | POA: Diagnosis not present

## 2017-09-06 DIAGNOSIS — B3749 Other urogenital candidiasis: Secondary | ICD-10-CM | POA: Diagnosis not present

## 2017-09-06 DIAGNOSIS — N183 Chronic kidney disease, stage 3 (moderate): Secondary | ICD-10-CM | POA: Diagnosis not present

## 2017-09-06 DIAGNOSIS — R1310 Dysphagia, unspecified: Secondary | ICD-10-CM | POA: Diagnosis not present

## 2017-09-06 DIAGNOSIS — W19XXXA Unspecified fall, initial encounter: Secondary | ICD-10-CM | POA: Diagnosis not present

## 2017-09-06 DIAGNOSIS — K769 Liver disease, unspecified: Secondary | ICD-10-CM | POA: Diagnosis not present

## 2017-09-06 DIAGNOSIS — S72002S Fracture of unspecified part of neck of left femur, sequela: Secondary | ICD-10-CM | POA: Diagnosis not present

## 2017-09-06 DIAGNOSIS — R2689 Other abnormalities of gait and mobility: Secondary | ICD-10-CM | POA: Diagnosis not present

## 2017-09-06 DIAGNOSIS — M6281 Muscle weakness (generalized): Secondary | ICD-10-CM | POA: Diagnosis not present

## 2017-09-06 DIAGNOSIS — R5383 Other fatigue: Secondary | ICD-10-CM | POA: Diagnosis not present

## 2017-09-06 DIAGNOSIS — G20 Parkinson's disease: Secondary | ICD-10-CM | POA: Diagnosis not present

## 2017-09-06 DIAGNOSIS — S72092D Other fracture of head and neck of left femur, subsequent encounter for closed fracture with routine healing: Secondary | ICD-10-CM | POA: Diagnosis not present

## 2017-09-06 DIAGNOSIS — Z9181 History of falling: Secondary | ICD-10-CM | POA: Diagnosis not present

## 2017-09-06 DIAGNOSIS — R262 Difficulty in walking, not elsewhere classified: Secondary | ICD-10-CM | POA: Diagnosis not present

## 2017-09-06 DIAGNOSIS — E119 Type 2 diabetes mellitus without complications: Secondary | ICD-10-CM | POA: Diagnosis not present

## 2017-09-10 DIAGNOSIS — S72002A Fracture of unspecified part of neck of left femur, initial encounter for closed fracture: Secondary | ICD-10-CM | POA: Diagnosis not present

## 2017-09-10 DIAGNOSIS — G20 Parkinson's disease: Secondary | ICD-10-CM | POA: Diagnosis not present

## 2017-09-10 DIAGNOSIS — R262 Difficulty in walking, not elsewhere classified: Secondary | ICD-10-CM | POA: Diagnosis not present

## 2017-09-13 DIAGNOSIS — R1310 Dysphagia, unspecified: Secondary | ICD-10-CM | POA: Diagnosis not present

## 2017-09-16 DIAGNOSIS — S72002A Fracture of unspecified part of neck of left femur, initial encounter for closed fracture: Secondary | ICD-10-CM | POA: Diagnosis not present

## 2017-09-20 DIAGNOSIS — B3749 Other urogenital candidiasis: Secondary | ICD-10-CM | POA: Diagnosis not present

## 2017-09-24 DIAGNOSIS — S72002S Fracture of unspecified part of neck of left femur, sequela: Secondary | ICD-10-CM | POA: Diagnosis not present

## 2017-09-24 DIAGNOSIS — N189 Chronic kidney disease, unspecified: Secondary | ICD-10-CM | POA: Diagnosis not present

## 2017-09-24 DIAGNOSIS — G20 Parkinson's disease: Secondary | ICD-10-CM | POA: Diagnosis not present

## 2017-09-25 DIAGNOSIS — S72012D Unspecified intracapsular fracture of left femur, subsequent encounter for closed fracture with routine healing: Secondary | ICD-10-CM | POA: Diagnosis not present

## 2017-09-25 DIAGNOSIS — R1312 Dysphagia, oropharyngeal phase: Secondary | ICD-10-CM | POA: Diagnosis not present

## 2017-09-25 DIAGNOSIS — G20 Parkinson's disease: Secondary | ICD-10-CM | POA: Diagnosis not present

## 2017-09-27 DIAGNOSIS — S72012D Unspecified intracapsular fracture of left femur, subsequent encounter for closed fracture with routine healing: Secondary | ICD-10-CM | POA: Diagnosis not present

## 2017-09-27 DIAGNOSIS — G20 Parkinson's disease: Secondary | ICD-10-CM | POA: Diagnosis not present

## 2017-09-27 DIAGNOSIS — B372 Candidiasis of skin and nail: Secondary | ICD-10-CM | POA: Diagnosis not present

## 2017-09-27 DIAGNOSIS — R1312 Dysphagia, oropharyngeal phase: Secondary | ICD-10-CM | POA: Diagnosis not present

## 2017-09-27 DIAGNOSIS — S72009A Fracture of unspecified part of neck of unspecified femur, initial encounter for closed fracture: Secondary | ICD-10-CM | POA: Diagnosis not present

## 2017-09-27 DIAGNOSIS — I10 Essential (primary) hypertension: Secondary | ICD-10-CM | POA: Diagnosis not present

## 2017-09-28 DIAGNOSIS — R1312 Dysphagia, oropharyngeal phase: Secondary | ICD-10-CM | POA: Diagnosis not present

## 2017-09-28 DIAGNOSIS — G20 Parkinson's disease: Secondary | ICD-10-CM | POA: Diagnosis not present

## 2017-09-28 DIAGNOSIS — S72012D Unspecified intracapsular fracture of left femur, subsequent encounter for closed fracture with routine healing: Secondary | ICD-10-CM | POA: Diagnosis not present

## 2017-09-29 DIAGNOSIS — G20 Parkinson's disease: Secondary | ICD-10-CM | POA: Diagnosis not present

## 2017-09-29 DIAGNOSIS — S72012D Unspecified intracapsular fracture of left femur, subsequent encounter for closed fracture with routine healing: Secondary | ICD-10-CM | POA: Diagnosis not present

## 2017-09-29 DIAGNOSIS — R1312 Dysphagia, oropharyngeal phase: Secondary | ICD-10-CM | POA: Diagnosis not present

## 2017-09-30 DIAGNOSIS — G20 Parkinson's disease: Secondary | ICD-10-CM | POA: Diagnosis not present

## 2017-09-30 DIAGNOSIS — S72012D Unspecified intracapsular fracture of left femur, subsequent encounter for closed fracture with routine healing: Secondary | ICD-10-CM | POA: Diagnosis not present

## 2017-09-30 DIAGNOSIS — R1312 Dysphagia, oropharyngeal phase: Secondary | ICD-10-CM | POA: Diagnosis not present

## 2017-10-01 DIAGNOSIS — S72012D Unspecified intracapsular fracture of left femur, subsequent encounter for closed fracture with routine healing: Secondary | ICD-10-CM | POA: Diagnosis not present

## 2017-10-01 DIAGNOSIS — G20 Parkinson's disease: Secondary | ICD-10-CM | POA: Diagnosis not present

## 2017-10-01 DIAGNOSIS — R1312 Dysphagia, oropharyngeal phase: Secondary | ICD-10-CM | POA: Diagnosis not present

## 2017-10-04 DIAGNOSIS — S72012D Unspecified intracapsular fracture of left femur, subsequent encounter for closed fracture with routine healing: Secondary | ICD-10-CM | POA: Diagnosis not present

## 2017-10-04 DIAGNOSIS — G20 Parkinson's disease: Secondary | ICD-10-CM | POA: Diagnosis not present

## 2017-10-04 DIAGNOSIS — R1312 Dysphagia, oropharyngeal phase: Secondary | ICD-10-CM | POA: Diagnosis not present

## 2017-10-05 DIAGNOSIS — G20 Parkinson's disease: Secondary | ICD-10-CM | POA: Diagnosis not present

## 2017-10-05 DIAGNOSIS — R1312 Dysphagia, oropharyngeal phase: Secondary | ICD-10-CM | POA: Diagnosis not present

## 2017-10-05 DIAGNOSIS — S72012D Unspecified intracapsular fracture of left femur, subsequent encounter for closed fracture with routine healing: Secondary | ICD-10-CM | POA: Diagnosis not present

## 2017-10-06 DIAGNOSIS — S72012D Unspecified intracapsular fracture of left femur, subsequent encounter for closed fracture with routine healing: Secondary | ICD-10-CM | POA: Diagnosis not present

## 2017-10-06 DIAGNOSIS — G20 Parkinson's disease: Secondary | ICD-10-CM | POA: Diagnosis not present

## 2017-10-06 DIAGNOSIS — R1312 Dysphagia, oropharyngeal phase: Secondary | ICD-10-CM | POA: Diagnosis not present

## 2017-10-08 DIAGNOSIS — G20 Parkinson's disease: Secondary | ICD-10-CM | POA: Diagnosis not present

## 2017-10-08 DIAGNOSIS — R1312 Dysphagia, oropharyngeal phase: Secondary | ICD-10-CM | POA: Diagnosis not present

## 2017-10-08 DIAGNOSIS — S72012D Unspecified intracapsular fracture of left femur, subsequent encounter for closed fracture with routine healing: Secondary | ICD-10-CM | POA: Diagnosis not present

## 2017-10-11 DIAGNOSIS — G20 Parkinson's disease: Secondary | ICD-10-CM | POA: Diagnosis not present

## 2017-10-11 DIAGNOSIS — S72012D Unspecified intracapsular fracture of left femur, subsequent encounter for closed fracture with routine healing: Secondary | ICD-10-CM | POA: Diagnosis not present

## 2017-10-11 DIAGNOSIS — R1312 Dysphagia, oropharyngeal phase: Secondary | ICD-10-CM | POA: Diagnosis not present

## 2017-10-12 DIAGNOSIS — G20 Parkinson's disease: Secondary | ICD-10-CM | POA: Diagnosis not present

## 2017-10-12 DIAGNOSIS — R1312 Dysphagia, oropharyngeal phase: Secondary | ICD-10-CM | POA: Diagnosis not present

## 2017-10-12 DIAGNOSIS — S72012D Unspecified intracapsular fracture of left femur, subsequent encounter for closed fracture with routine healing: Secondary | ICD-10-CM | POA: Diagnosis not present

## 2017-10-14 DIAGNOSIS — G20 Parkinson's disease: Secondary | ICD-10-CM | POA: Diagnosis not present

## 2017-10-14 DIAGNOSIS — R1312 Dysphagia, oropharyngeal phase: Secondary | ICD-10-CM | POA: Diagnosis not present

## 2017-10-14 DIAGNOSIS — S72012D Unspecified intracapsular fracture of left femur, subsequent encounter for closed fracture with routine healing: Secondary | ICD-10-CM | POA: Diagnosis not present

## 2017-10-18 DIAGNOSIS — G20 Parkinson's disease: Secondary | ICD-10-CM | POA: Diagnosis not present

## 2017-10-18 DIAGNOSIS — S72012D Unspecified intracapsular fracture of left femur, subsequent encounter for closed fracture with routine healing: Secondary | ICD-10-CM | POA: Diagnosis not present

## 2017-10-18 DIAGNOSIS — R1312 Dysphagia, oropharyngeal phase: Secondary | ICD-10-CM | POA: Diagnosis not present

## 2017-10-19 DIAGNOSIS — S72012D Unspecified intracapsular fracture of left femur, subsequent encounter for closed fracture with routine healing: Secondary | ICD-10-CM | POA: Diagnosis not present

## 2017-10-19 DIAGNOSIS — R1312 Dysphagia, oropharyngeal phase: Secondary | ICD-10-CM | POA: Diagnosis not present

## 2017-10-19 DIAGNOSIS — G20 Parkinson's disease: Secondary | ICD-10-CM | POA: Diagnosis not present

## 2017-10-25 DIAGNOSIS — S72002A Fracture of unspecified part of neck of left femur, initial encounter for closed fracture: Secondary | ICD-10-CM | POA: Diagnosis not present

## 2017-11-05 DIAGNOSIS — N184 Chronic kidney disease, stage 4 (severe): Secondary | ICD-10-CM | POA: Diagnosis not present

## 2017-11-10 DIAGNOSIS — N119 Chronic tubulo-interstitial nephritis, unspecified: Secondary | ICD-10-CM | POA: Diagnosis not present

## 2017-11-10 DIAGNOSIS — E889 Metabolic disorder, unspecified: Secondary | ICD-10-CM | POA: Diagnosis not present

## 2017-11-10 DIAGNOSIS — E872 Acidosis: Secondary | ICD-10-CM | POA: Diagnosis not present

## 2017-11-10 DIAGNOSIS — E559 Vitamin D deficiency, unspecified: Secondary | ICD-10-CM | POA: Diagnosis not present

## 2018-01-10 DIAGNOSIS — R05 Cough: Secondary | ICD-10-CM | POA: Diagnosis not present

## 2018-01-13 DIAGNOSIS — R05 Cough: Secondary | ICD-10-CM | POA: Diagnosis not present

## 2018-01-18 DIAGNOSIS — R062 Wheezing: Secondary | ICD-10-CM | POA: Diagnosis not present

## 2018-01-18 DIAGNOSIS — R05 Cough: Secondary | ICD-10-CM | POA: Diagnosis not present

## 2018-04-12 DIAGNOSIS — G2581 Restless legs syndrome: Secondary | ICD-10-CM | POA: Diagnosis not present

## 2018-04-12 DIAGNOSIS — I10 Essential (primary) hypertension: Secondary | ICD-10-CM | POA: Diagnosis not present

## 2018-05-17 DIAGNOSIS — N119 Chronic tubulo-interstitial nephritis, unspecified: Secondary | ICD-10-CM | POA: Diagnosis not present

## 2018-05-17 DIAGNOSIS — D631 Anemia in chronic kidney disease: Secondary | ICD-10-CM | POA: Diagnosis not present

## 2018-05-17 DIAGNOSIS — E559 Vitamin D deficiency, unspecified: Secondary | ICD-10-CM | POA: Diagnosis not present

## 2018-05-17 DIAGNOSIS — E889 Metabolic disorder, unspecified: Secondary | ICD-10-CM | POA: Diagnosis not present

## 2018-05-17 DIAGNOSIS — N184 Chronic kidney disease, stage 4 (severe): Secondary | ICD-10-CM | POA: Diagnosis not present

## 2018-05-17 DIAGNOSIS — E872 Acidosis: Secondary | ICD-10-CM | POA: Diagnosis not present

## 2018-07-12 DIAGNOSIS — G2581 Restless legs syndrome: Secondary | ICD-10-CM | POA: Diagnosis not present

## 2018-07-12 DIAGNOSIS — I10 Essential (primary) hypertension: Secondary | ICD-10-CM | POA: Diagnosis not present

## 2018-08-26 DIAGNOSIS — Z23 Encounter for immunization: Secondary | ICD-10-CM | POA: Diagnosis not present

## 2018-09-04 DIAGNOSIS — G20 Parkinson's disease: Secondary | ICD-10-CM | POA: Diagnosis not present

## 2018-09-11 DIAGNOSIS — J189 Pneumonia, unspecified organism: Secondary | ICD-10-CM | POA: Diagnosis not present

## 2018-09-12 DIAGNOSIS — J189 Pneumonia, unspecified organism: Secondary | ICD-10-CM | POA: Diagnosis not present

## 2018-09-12 DIAGNOSIS — R062 Wheezing: Secondary | ICD-10-CM | POA: Diagnosis not present

## 2018-09-12 DIAGNOSIS — R05 Cough: Secondary | ICD-10-CM | POA: Diagnosis not present

## 2018-10-11 DIAGNOSIS — N184 Chronic kidney disease, stage 4 (severe): Secondary | ICD-10-CM | POA: Diagnosis not present

## 2018-10-11 DIAGNOSIS — Z7982 Long term (current) use of aspirin: Secondary | ICD-10-CM | POA: Diagnosis not present

## 2018-10-11 DIAGNOSIS — G20 Parkinson's disease: Secondary | ICD-10-CM | POA: Diagnosis not present

## 2018-10-11 DIAGNOSIS — I129 Hypertensive chronic kidney disease with stage 1 through stage 4 chronic kidney disease, or unspecified chronic kidney disease: Secondary | ICD-10-CM | POA: Diagnosis not present

## 2018-10-11 DIAGNOSIS — Z9181 History of falling: Secondary | ICD-10-CM | POA: Diagnosis not present

## 2018-10-11 DIAGNOSIS — D631 Anemia in chronic kidney disease: Secondary | ICD-10-CM | POA: Diagnosis not present

## 2018-10-11 DIAGNOSIS — Z87440 Personal history of urinary (tract) infections: Secondary | ICD-10-CM | POA: Diagnosis not present

## 2018-10-11 DIAGNOSIS — K219 Gastro-esophageal reflux disease without esophagitis: Secondary | ICD-10-CM | POA: Diagnosis not present

## 2018-10-12 DIAGNOSIS — K219 Gastro-esophageal reflux disease without esophagitis: Secondary | ICD-10-CM | POA: Diagnosis not present

## 2018-10-12 DIAGNOSIS — Z9181 History of falling: Secondary | ICD-10-CM | POA: Diagnosis not present

## 2018-10-12 DIAGNOSIS — G20 Parkinson's disease: Secondary | ICD-10-CM | POA: Diagnosis not present

## 2018-10-12 DIAGNOSIS — Z7982 Long term (current) use of aspirin: Secondary | ICD-10-CM | POA: Diagnosis not present

## 2018-10-12 DIAGNOSIS — D631 Anemia in chronic kidney disease: Secondary | ICD-10-CM | POA: Diagnosis not present

## 2018-10-12 DIAGNOSIS — Z87440 Personal history of urinary (tract) infections: Secondary | ICD-10-CM | POA: Diagnosis not present

## 2018-10-12 DIAGNOSIS — I129 Hypertensive chronic kidney disease with stage 1 through stage 4 chronic kidney disease, or unspecified chronic kidney disease: Secondary | ICD-10-CM | POA: Diagnosis not present

## 2018-10-12 DIAGNOSIS — N184 Chronic kidney disease, stage 4 (severe): Secondary | ICD-10-CM | POA: Diagnosis not present

## 2018-10-13 DIAGNOSIS — K219 Gastro-esophageal reflux disease without esophagitis: Secondary | ICD-10-CM | POA: Diagnosis not present

## 2018-10-13 DIAGNOSIS — I129 Hypertensive chronic kidney disease with stage 1 through stage 4 chronic kidney disease, or unspecified chronic kidney disease: Secondary | ICD-10-CM | POA: Diagnosis not present

## 2018-10-13 DIAGNOSIS — Z87440 Personal history of urinary (tract) infections: Secondary | ICD-10-CM | POA: Diagnosis not present

## 2018-10-13 DIAGNOSIS — Z9181 History of falling: Secondary | ICD-10-CM | POA: Diagnosis not present

## 2018-10-13 DIAGNOSIS — D631 Anemia in chronic kidney disease: Secondary | ICD-10-CM | POA: Diagnosis not present

## 2018-10-13 DIAGNOSIS — G20 Parkinson's disease: Secondary | ICD-10-CM | POA: Diagnosis not present

## 2018-10-13 DIAGNOSIS — Z7982 Long term (current) use of aspirin: Secondary | ICD-10-CM | POA: Diagnosis not present

## 2018-10-13 DIAGNOSIS — N184 Chronic kidney disease, stage 4 (severe): Secondary | ICD-10-CM | POA: Diagnosis not present

## 2018-10-14 DIAGNOSIS — Z0000 Encounter for general adult medical examination without abnormal findings: Secondary | ICD-10-CM | POA: Diagnosis not present

## 2018-10-14 DIAGNOSIS — N184 Chronic kidney disease, stage 4 (severe): Secondary | ICD-10-CM | POA: Diagnosis not present

## 2018-10-14 DIAGNOSIS — I10 Essential (primary) hypertension: Secondary | ICD-10-CM | POA: Diagnosis not present

## 2018-10-14 DIAGNOSIS — E785 Hyperlipidemia, unspecified: Secondary | ICD-10-CM | POA: Diagnosis not present

## 2018-10-14 DIAGNOSIS — D649 Anemia, unspecified: Secondary | ICD-10-CM | POA: Diagnosis not present

## 2018-10-14 DIAGNOSIS — E559 Vitamin D deficiency, unspecified: Secondary | ICD-10-CM | POA: Diagnosis not present

## 2018-10-14 DIAGNOSIS — Z1389 Encounter for screening for other disorder: Secondary | ICD-10-CM | POA: Diagnosis not present

## 2018-10-18 DIAGNOSIS — Z9181 History of falling: Secondary | ICD-10-CM | POA: Diagnosis not present

## 2018-10-18 DIAGNOSIS — D631 Anemia in chronic kidney disease: Secondary | ICD-10-CM | POA: Diagnosis not present

## 2018-10-18 DIAGNOSIS — K219 Gastro-esophageal reflux disease without esophagitis: Secondary | ICD-10-CM | POA: Diagnosis not present

## 2018-10-18 DIAGNOSIS — Z87440 Personal history of urinary (tract) infections: Secondary | ICD-10-CM | POA: Diagnosis not present

## 2018-10-18 DIAGNOSIS — I129 Hypertensive chronic kidney disease with stage 1 through stage 4 chronic kidney disease, or unspecified chronic kidney disease: Secondary | ICD-10-CM | POA: Diagnosis not present

## 2018-10-18 DIAGNOSIS — G20 Parkinson's disease: Secondary | ICD-10-CM | POA: Diagnosis not present

## 2018-10-18 DIAGNOSIS — N184 Chronic kidney disease, stage 4 (severe): Secondary | ICD-10-CM | POA: Diagnosis not present

## 2018-10-18 DIAGNOSIS — Z7982 Long term (current) use of aspirin: Secondary | ICD-10-CM | POA: Diagnosis not present

## 2018-10-20 DIAGNOSIS — N184 Chronic kidney disease, stage 4 (severe): Secondary | ICD-10-CM | POA: Diagnosis not present

## 2018-10-20 DIAGNOSIS — Z9181 History of falling: Secondary | ICD-10-CM | POA: Diagnosis not present

## 2018-10-20 DIAGNOSIS — Z7982 Long term (current) use of aspirin: Secondary | ICD-10-CM | POA: Diagnosis not present

## 2018-10-20 DIAGNOSIS — G20 Parkinson's disease: Secondary | ICD-10-CM | POA: Diagnosis not present

## 2018-10-20 DIAGNOSIS — I129 Hypertensive chronic kidney disease with stage 1 through stage 4 chronic kidney disease, or unspecified chronic kidney disease: Secondary | ICD-10-CM | POA: Diagnosis not present

## 2018-10-20 DIAGNOSIS — D631 Anemia in chronic kidney disease: Secondary | ICD-10-CM | POA: Diagnosis not present

## 2018-10-20 DIAGNOSIS — K219 Gastro-esophageal reflux disease without esophagitis: Secondary | ICD-10-CM | POA: Diagnosis not present

## 2018-10-20 DIAGNOSIS — Z87440 Personal history of urinary (tract) infections: Secondary | ICD-10-CM | POA: Diagnosis not present

## 2018-10-24 DIAGNOSIS — G20 Parkinson's disease: Secondary | ICD-10-CM | POA: Diagnosis not present

## 2018-10-24 DIAGNOSIS — D631 Anemia in chronic kidney disease: Secondary | ICD-10-CM | POA: Diagnosis not present

## 2018-10-24 DIAGNOSIS — I129 Hypertensive chronic kidney disease with stage 1 through stage 4 chronic kidney disease, or unspecified chronic kidney disease: Secondary | ICD-10-CM | POA: Diagnosis not present

## 2018-10-24 DIAGNOSIS — Z7982 Long term (current) use of aspirin: Secondary | ICD-10-CM | POA: Diagnosis not present

## 2018-10-24 DIAGNOSIS — Z87440 Personal history of urinary (tract) infections: Secondary | ICD-10-CM | POA: Diagnosis not present

## 2018-10-24 DIAGNOSIS — Z9181 History of falling: Secondary | ICD-10-CM | POA: Diagnosis not present

## 2018-10-24 DIAGNOSIS — N184 Chronic kidney disease, stage 4 (severe): Secondary | ICD-10-CM | POA: Diagnosis not present

## 2018-10-24 DIAGNOSIS — K219 Gastro-esophageal reflux disease without esophagitis: Secondary | ICD-10-CM | POA: Diagnosis not present

## 2018-11-15 DIAGNOSIS — E875 Hyperkalemia: Secondary | ICD-10-CM | POA: Diagnosis not present

## 2018-11-15 DIAGNOSIS — M9080 Osteopathy in diseases classified elsewhere, unspecified site: Secondary | ICD-10-CM | POA: Diagnosis not present

## 2018-11-15 DIAGNOSIS — N184 Chronic kidney disease, stage 4 (severe): Secondary | ICD-10-CM | POA: Diagnosis not present

## 2018-11-15 DIAGNOSIS — E889 Metabolic disorder, unspecified: Secondary | ICD-10-CM | POA: Diagnosis not present

## 2018-11-15 DIAGNOSIS — E559 Vitamin D deficiency, unspecified: Secondary | ICD-10-CM | POA: Diagnosis not present

## 2018-11-17 DIAGNOSIS — N184 Chronic kidney disease, stage 4 (severe): Secondary | ICD-10-CM | POA: Diagnosis not present

## 2018-11-17 DIAGNOSIS — I872 Venous insufficiency (chronic) (peripheral): Secondary | ICD-10-CM | POA: Diagnosis not present

## 2019-01-16 DIAGNOSIS — G301 Alzheimer's disease with late onset: Secondary | ICD-10-CM | POA: Diagnosis not present

## 2019-01-16 DIAGNOSIS — I10 Essential (primary) hypertension: Secondary | ICD-10-CM | POA: Diagnosis not present

## 2019-05-25 DIAGNOSIS — N184 Chronic kidney disease, stage 4 (severe): Secondary | ICD-10-CM | POA: Diagnosis not present

## 2019-06-08 DIAGNOSIS — N119 Chronic tubulo-interstitial nephritis, unspecified: Secondary | ICD-10-CM | POA: Diagnosis not present

## 2019-06-08 DIAGNOSIS — E889 Metabolic disorder, unspecified: Secondary | ICD-10-CM | POA: Diagnosis not present

## 2019-06-08 DIAGNOSIS — E559 Vitamin D deficiency, unspecified: Secondary | ICD-10-CM | POA: Diagnosis not present

## 2019-06-08 DIAGNOSIS — N184 Chronic kidney disease, stage 4 (severe): Secondary | ICD-10-CM | POA: Diagnosis not present

## 2019-06-08 DIAGNOSIS — E872 Acidosis: Secondary | ICD-10-CM | POA: Diagnosis not present

## 2019-06-12 DIAGNOSIS — N390 Urinary tract infection, site not specified: Secondary | ICD-10-CM | POA: Diagnosis not present

## 2019-06-12 DIAGNOSIS — R300 Dysuria: Secondary | ICD-10-CM | POA: Diagnosis not present

## 2019-06-13 DIAGNOSIS — N390 Urinary tract infection, site not specified: Secondary | ICD-10-CM | POA: Diagnosis not present

## 2019-12-11 DIAGNOSIS — N184 Chronic kidney disease, stage 4 (severe): Secondary | ICD-10-CM | POA: Diagnosis not present

## 2019-12-11 DIAGNOSIS — G301 Alzheimer's disease with late onset: Secondary | ICD-10-CM | POA: Diagnosis not present

## 2019-12-11 DIAGNOSIS — K219 Gastro-esophageal reflux disease without esophagitis: Secondary | ICD-10-CM | POA: Diagnosis not present

## 2020-01-11 DIAGNOSIS — S51812A Laceration without foreign body of left forearm, initial encounter: Secondary | ICD-10-CM | POA: Diagnosis not present

## 2020-02-27 DIAGNOSIS — B9689 Other specified bacterial agents as the cause of diseases classified elsewhere: Secondary | ICD-10-CM | POA: Diagnosis not present

## 2020-02-27 DIAGNOSIS — I517 Cardiomegaly: Secondary | ICD-10-CM | POA: Diagnosis not present

## 2020-02-27 DIAGNOSIS — N3000 Acute cystitis without hematuria: Secondary | ICD-10-CM | POA: Diagnosis not present

## 2020-03-01 DIAGNOSIS — N390 Urinary tract infection, site not specified: Secondary | ICD-10-CM | POA: Diagnosis not present

## 2020-06-12 DIAGNOSIS — R197 Diarrhea, unspecified: Secondary | ICD-10-CM | POA: Diagnosis not present

## 2020-06-12 DIAGNOSIS — N3091 Cystitis, unspecified with hematuria: Secondary | ICD-10-CM | POA: Diagnosis not present

## 2020-06-12 DIAGNOSIS — K591 Functional diarrhea: Secondary | ICD-10-CM | POA: Diagnosis not present

## 2020-06-12 DIAGNOSIS — N390 Urinary tract infection, site not specified: Secondary | ICD-10-CM | POA: Diagnosis not present

## 2020-06-25 DIAGNOSIS — G309 Alzheimer's disease, unspecified: Secondary | ICD-10-CM | POA: Diagnosis not present

## 2020-06-25 DIAGNOSIS — I10 Essential (primary) hypertension: Secondary | ICD-10-CM | POA: Diagnosis not present

## 2020-06-25 DIAGNOSIS — Z1389 Encounter for screening for other disorder: Secondary | ICD-10-CM | POA: Diagnosis not present

## 2020-06-25 DIAGNOSIS — I872 Venous insufficiency (chronic) (peripheral): Secondary | ICD-10-CM | POA: Diagnosis not present

## 2020-06-25 DIAGNOSIS — E559 Vitamin D deficiency, unspecified: Secondary | ICD-10-CM | POA: Diagnosis not present

## 2020-06-25 DIAGNOSIS — Z23 Encounter for immunization: Secondary | ICD-10-CM | POA: Diagnosis not present

## 2020-06-25 DIAGNOSIS — N184 Chronic kidney disease, stage 4 (severe): Secondary | ICD-10-CM | POA: Diagnosis not present

## 2020-06-25 DIAGNOSIS — D649 Anemia, unspecified: Secondary | ICD-10-CM | POA: Diagnosis not present

## 2020-06-25 DIAGNOSIS — Z0000 Encounter for general adult medical examination without abnormal findings: Secondary | ICD-10-CM | POA: Diagnosis not present

## 2020-07-12 DIAGNOSIS — R197 Diarrhea, unspecified: Secondary | ICD-10-CM | POA: Diagnosis not present

## 2020-07-12 DIAGNOSIS — L918 Other hypertrophic disorders of the skin: Secondary | ICD-10-CM | POA: Diagnosis not present

## 2020-07-18 ENCOUNTER — Ambulatory Visit (INDEPENDENT_AMBULATORY_CARE_PROVIDER_SITE_OTHER): Payer: Medicare Other | Admitting: Neurology

## 2020-07-18 ENCOUNTER — Encounter: Payer: Self-pay | Admitting: Neurology

## 2020-07-18 DIAGNOSIS — F0390 Unspecified dementia without behavioral disturbance: Secondary | ICD-10-CM | POA: Insufficient documentation

## 2020-07-18 DIAGNOSIS — F0280 Dementia in other diseases classified elsewhere without behavioral disturbance: Secondary | ICD-10-CM

## 2020-07-18 DIAGNOSIS — G300 Alzheimer's disease with early onset: Secondary | ICD-10-CM

## 2020-07-18 NOTE — Progress Notes (Signed)
Reason for visit: Dementia  Referring physician: Dr. Jeani Hawking Gurbani Figge is a 79 y.o. female  History of present illness:  Ms. Hornberger is an 79 year old right-handed white female with a history of bipolar disorder and a progressive dementia.  The patient comes with her husband who claims that she has been on medication for her "nerves" since 35.  She has apparently had a progressive dementing illness since her early 77s, her husband has been taken care of her for least 20 years with dementia.  The memory issues have slowly changed over time.  Currently, the patient requires total care, she needs assistance with bathing, dressing, feeding, and with keeping up medications, appointments, and with her finances.  The patient is not always able to communicate verbally, sometimes she will say things that make sense and other times not.  There have been some episodes of being fidgety and agitated.  Overall, the husband indicates that there has been really no change in her cognitive status for several years.  The patient is able to ambulate, but is somewhat restricted in how far she can walk.  She has not had any recent falls, she did have 1 fall 2 years ago.  The patient gets active psychiatric care, she is sent to this office for an evaluation.  Past Medical History:  Diagnosis Date  . Bipolar 1 disorder ([place])   . Dementia ([place])     History reviewed. No pertinent surgical history.  History reviewed. No pertinent family history.  Social history:  reports that she has never smoked. She has never used smokeless tobacco. She reports that she does not drink alcohol and does not use drugs.  Medications:  Prior to Admission medications   Medication Sig Start Date End Date Taking? Authorizing Provider  ARIPiprazole (ABILIFY) 10 MG tablet Take 10 mg by mouth daily.   Yes [provider]  aspirin 81 MG chewable tablet Chew by mouth.   Yes [provider]  citalopram  (CELEXA) 20 MG tablet Take by mouth.   Yes [provider]  donepezil (ARICEPT) 10 MG tablet Take by mouth.   Yes [provider]  ergocalciferol (VITAMIN D2) 1.25 MG (50000 UT) capsule Take 1 capsule by mouth once a week. 06/20/19  Yes [provider]  fenofibrate (TRICOR) 145 MG tablet Take by mouth.   Yes [provider]  loperamide (IMODIUM) 2 MG capsule Take 2 mg by mouth daily as needed. 06/25/20  Yes [provider]  memantine (NAMENDA) 5 MG tablet TAKE ONE (1) TABLET ONCE DAILY 07/06/20  Yes [provider]  metoprolol succinate (TOPROL-XL) 25 MG 24 hr tablet Take by mouth.   Yes [provider]  omeprazole (PRILOSEC) 20 MG capsule Take by mouth.   Yes [provider]  sertraline (ZOLOFT) 50 MG tablet Take 50 mg by mouth daily. 07/06/20  Yes [provider]  tamsulosin (FLOMAX) 0.4 MG CAPS capsule Take 0.4 mg by mouth daily. 07/06/20  Yes [provider]     No Known Allergies  ROS:  Out of a complete 14 system review of symptoms, the patient complains only of the following symptoms, and all other reviewed systems are negative.  Dementia, confusion Walking difficulty Weight gain  Blood pressure 136/90, pulse 74, height 5\' 3"  (1.6 m), weight 178 lb (80.7 kg).  Physical Exam  General: The patient is alert and cooperative at the time of the examination.  The patient is obese.  Eyes: Pupils are  equal, round, and reactive to light.  Neck: The neck is supple, no carotid bruits are noted.  Respiratory: The respiratory examination is clear.  Cardiovascular: The cardiovascular examination reveals a regular rate and rhythm, no obvious murmurs or rubs are noted.  Skin: Extremities are without significant edema.  Neurologic Exam  Mental status: The patient is alert.  The patient is able to state her name and the name of her husband, she is not oriented to place or date.  Cranial nerves:  Facial symmetry is present.  The patient will blink to threat bilaterally, has full extraocular movements.  When she does speak, speech is well enunciated.  At times, she does not make any sense with her speech.   Motor: The patient moves all 4 extremities well, apparently good strength.  Sensory: The patient appears to respond to pain stimulus on all 4 extremities.  Coordination: Cerebellar testing reveals no obvious dysmetria with the upper extremities, the patient would not cooperate for heel-to-shin testing.  Gait and station: The patient is able to stand with assistance and take few steps with examiner, gait is somewhat wide-based.  Reflexes: Deep tendon reflexes are symmetric and normal bilaterally.   Assessment/Plan:  1.  Progressive dementing illness  2.  Bipolar disorder  The patient was referred to this office for what appears to be a concern about a recent change in cognitive status.  According to the husband, the patient has had a significant dementia for 20 years, she has been on Aricept and Namenda for many years.  The husband denies any recent change in her cognitive status.  For this reason, I will see the patient back on as-needed basis, no further evaluation will be done at this time.  The patient appears to be relatively end-stage with her dementia at this time.  Jill Alexanders MD 07/18/2020 4:19 PM  [facility] [address] [place], [place] [address]  Phone [phone] Fax [phone]

## 2020-09-19 DIAGNOSIS — I10 Essential (primary) hypertension: Secondary | ICD-10-CM | POA: Diagnosis not present

## 2020-09-19 DIAGNOSIS — G301 Alzheimer's disease with late onset: Secondary | ICD-10-CM | POA: Diagnosis not present

## 2020-11-05 DIAGNOSIS — G301 Alzheimer's disease with late onset: Secondary | ICD-10-CM | POA: Diagnosis not present

## 2020-11-05 DIAGNOSIS — Z79899 Other long term (current) drug therapy: Secondary | ICD-10-CM | POA: Diagnosis not present

## 2020-11-05 DIAGNOSIS — Z13220 Encounter for screening for lipoid disorders: Secondary | ICD-10-CM | POA: Diagnosis not present

## 2020-11-05 DIAGNOSIS — Z131 Encounter for screening for diabetes mellitus: Secondary | ICD-10-CM | POA: Diagnosis not present

## 2020-11-27 DIAGNOSIS — K529 Noninfective gastroenteritis and colitis, unspecified: Secondary | ICD-10-CM | POA: Diagnosis not present

## 2020-11-27 DIAGNOSIS — K921 Melena: Secondary | ICD-10-CM | POA: Diagnosis not present

## 2020-12-01 DIAGNOSIS — Z79899 Other long term (current) drug therapy: Secondary | ICD-10-CM | POA: Diagnosis not present

## 2020-12-01 DIAGNOSIS — I10 Essential (primary) hypertension: Secondary | ICD-10-CM | POA: Diagnosis not present

## 2020-12-01 DIAGNOSIS — E7800 Pure hypercholesterolemia, unspecified: Secondary | ICD-10-CM | POA: Diagnosis not present

## 2020-12-01 DIAGNOSIS — M1990 Unspecified osteoarthritis, unspecified site: Secondary | ICD-10-CM | POA: Diagnosis not present

## 2020-12-01 DIAGNOSIS — G20 Parkinson's disease: Secondary | ICD-10-CM | POA: Diagnosis not present

## 2020-12-01 DIAGNOSIS — K5909 Other constipation: Secondary | ICD-10-CM | POA: Diagnosis not present

## 2020-12-01 DIAGNOSIS — K5641 Fecal impaction: Secondary | ICD-10-CM | POA: Diagnosis not present

## 2020-12-04 DIAGNOSIS — N1832 Chronic kidney disease, stage 3b: Secondary | ICD-10-CM | POA: Diagnosis not present

## 2020-12-04 DIAGNOSIS — G301 Alzheimer's disease with late onset: Secondary | ICD-10-CM | POA: Diagnosis not present

## 2020-12-04 DIAGNOSIS — I10 Essential (primary) hypertension: Secondary | ICD-10-CM | POA: Diagnosis not present

## 2020-12-04 DIAGNOSIS — K529 Noninfective gastroenteritis and colitis, unspecified: Secondary | ICD-10-CM | POA: Diagnosis not present

## 2020-12-04 DIAGNOSIS — K219 Gastro-esophageal reflux disease without esophagitis: Secondary | ICD-10-CM | POA: Diagnosis not present

## 2021-01-30 DIAGNOSIS — K648 Other hemorrhoids: Secondary | ICD-10-CM | POA: Diagnosis not present

## 2021-02-03 DIAGNOSIS — I10 Essential (primary) hypertension: Secondary | ICD-10-CM | POA: Diagnosis not present

## 2021-02-06 DIAGNOSIS — N1832 Chronic kidney disease, stage 3b: Secondary | ICD-10-CM | POA: Diagnosis not present

## 2021-02-06 DIAGNOSIS — C4492 Squamous cell carcinoma of skin, unspecified: Secondary | ICD-10-CM | POA: Diagnosis not present

## 2021-02-06 DIAGNOSIS — L989 Disorder of the skin and subcutaneous tissue, unspecified: Secondary | ICD-10-CM | POA: Diagnosis not present

## 2021-02-06 DIAGNOSIS — G301 Alzheimer's disease with late onset: Secondary | ICD-10-CM | POA: Diagnosis not present

## 2021-03-23 DIAGNOSIS — S80812A Abrasion, left lower leg, initial encounter: Secondary | ICD-10-CM | POA: Diagnosis not present

## 2021-03-23 DIAGNOSIS — L03115 Cellulitis of right lower limb: Secondary | ICD-10-CM | POA: Diagnosis not present

## 2021-04-01 DIAGNOSIS — T3695XA Adverse effect of unspecified systemic antibiotic, initial encounter: Secondary | ICD-10-CM | POA: Diagnosis not present

## 2021-04-01 DIAGNOSIS — S80811A Abrasion, right lower leg, initial encounter: Secondary | ICD-10-CM | POA: Diagnosis not present

## 2021-04-01 DIAGNOSIS — K521 Toxic gastroenteritis and colitis: Secondary | ICD-10-CM | POA: Diagnosis not present

## 2021-04-08 DIAGNOSIS — S80811A Abrasion, right lower leg, initial encounter: Secondary | ICD-10-CM | POA: Diagnosis not present

## 2021-05-18 DIAGNOSIS — N3000 Acute cystitis without hematuria: Secondary | ICD-10-CM | POA: Diagnosis not present

## 2021-05-18 DIAGNOSIS — N183 Chronic kidney disease, stage 3 unspecified: Secondary | ICD-10-CM | POA: Diagnosis not present

## 2021-05-18 DIAGNOSIS — I129 Hypertensive chronic kidney disease with stage 1 through stage 4 chronic kidney disease, or unspecified chronic kidney disease: Secondary | ICD-10-CM | POA: Diagnosis not present

## 2021-05-18 DIAGNOSIS — Z043 Encounter for examination and observation following other accident: Secondary | ICD-10-CM | POA: Diagnosis not present

## 2021-05-18 DIAGNOSIS — R451 Restlessness and agitation: Secondary | ICD-10-CM | POA: Diagnosis not present

## 2021-05-21 DIAGNOSIS — K529 Noninfective gastroenteritis and colitis, unspecified: Secondary | ICD-10-CM | POA: Diagnosis not present

## 2021-05-21 DIAGNOSIS — G301 Alzheimer's disease with late onset: Secondary | ICD-10-CM | POA: Diagnosis not present

## 2021-05-21 DIAGNOSIS — N3000 Acute cystitis without hematuria: Secondary | ICD-10-CM | POA: Diagnosis not present

## 2021-06-19 DIAGNOSIS — I499 Cardiac arrhythmia, unspecified: Secondary | ICD-10-CM | POA: Diagnosis not present

## 2021-06-19 DIAGNOSIS — R410 Disorientation, unspecified: Secondary | ICD-10-CM | POA: Diagnosis not present

## 2021-06-19 DIAGNOSIS — N3001 Acute cystitis with hematuria: Secondary | ICD-10-CM | POA: Diagnosis not present

## 2021-06-19 DIAGNOSIS — R531 Weakness: Secondary | ICD-10-CM | POA: Diagnosis not present

## 2021-06-19 DIAGNOSIS — I38 Endocarditis, valve unspecified: Secondary | ICD-10-CM | POA: Diagnosis not present

## 2021-06-19 DIAGNOSIS — Z743 Need for continuous supervision: Secondary | ICD-10-CM | POA: Diagnosis not present

## 2021-06-19 DIAGNOSIS — R404 Transient alteration of awareness: Secondary | ICD-10-CM | POA: Diagnosis not present

## 2021-06-19 DIAGNOSIS — I10 Essential (primary) hypertension: Secondary | ICD-10-CM | POA: Diagnosis not present

## 2021-06-25 DIAGNOSIS — G301 Alzheimer's disease with late onset: Secondary | ICD-10-CM | POA: Diagnosis not present

## 2021-06-25 DIAGNOSIS — N3000 Acute cystitis without hematuria: Secondary | ICD-10-CM | POA: Diagnosis not present

## 2021-06-25 DIAGNOSIS — J9601 Acute respiratory failure with hypoxia: Secondary | ICD-10-CM | POA: Diagnosis not present

## 2021-07-08 DIAGNOSIS — R0981 Nasal congestion: Secondary | ICD-10-CM | POA: Diagnosis not present

## 2021-07-08 DIAGNOSIS — K591 Functional diarrhea: Secondary | ICD-10-CM | POA: Diagnosis not present

## 2021-07-11 DIAGNOSIS — R0981 Nasal congestion: Secondary | ICD-10-CM | POA: Diagnosis not present

## 2021-07-11 DIAGNOSIS — J069 Acute upper respiratory infection, unspecified: Secondary | ICD-10-CM | POA: Diagnosis not present

## 2021-07-11 DIAGNOSIS — R051 Acute cough: Secondary | ICD-10-CM | POA: Diagnosis not present

## 2021-08-08 DIAGNOSIS — I10 Essential (primary) hypertension: Secondary | ICD-10-CM | POA: Diagnosis not present

## 2021-08-08 DIAGNOSIS — N1832 Chronic kidney disease, stage 3b: Secondary | ICD-10-CM | POA: Diagnosis not present

## 2021-08-30 ENCOUNTER — Other Ambulatory Visit: Payer: Self-pay

## 2021-08-30 ENCOUNTER — Emergency Department (HOSPITAL_COMMUNITY)
Admission: EM | Admit: 2021-08-30 | Discharge: 2021-08-30 | Disposition: A | Discharge status: Home / Self Care | Payer: Medicare Other | Attending: Emergency Medicine | Admitting: Emergency Medicine

## 2021-08-30 ENCOUNTER — Emergency Department (HOSPITAL_COMMUNITY): Payer: Medicare Other

## 2021-08-30 DIAGNOSIS — F0390 Unspecified dementia without behavioral disturbance: Secondary | ICD-10-CM | POA: Diagnosis not present

## 2021-08-30 DIAGNOSIS — N179 Acute kidney failure, unspecified: Secondary | ICD-10-CM | POA: Diagnosis not present

## 2021-08-30 DIAGNOSIS — S61412A Laceration without foreign body of left hand, initial encounter: Secondary | ICD-10-CM | POA: Diagnosis not present

## 2021-08-30 DIAGNOSIS — S0000XA Unspecified superficial injury of scalp, initial encounter: Secondary | ICD-10-CM | POA: Diagnosis present

## 2021-08-30 DIAGNOSIS — Z23 Encounter for immunization: Secondary | ICD-10-CM | POA: Diagnosis not present

## 2021-08-30 DIAGNOSIS — S199XXA Unspecified injury of neck, initial encounter: Secondary | ICD-10-CM | POA: Diagnosis not present

## 2021-08-30 DIAGNOSIS — S0101XA Laceration without foreign body of scalp, initial encounter: Secondary | ICD-10-CM | POA: Diagnosis not present

## 2021-08-30 DIAGNOSIS — R0902 Hypoxemia: Secondary | ICD-10-CM | POA: Diagnosis not present

## 2021-08-30 DIAGNOSIS — Z96642 Presence of left artificial hip joint: Secondary | ICD-10-CM | POA: Diagnosis not present

## 2021-08-30 DIAGNOSIS — Z043 Encounter for examination and observation following other accident: Secondary | ICD-10-CM | POA: Diagnosis not present

## 2021-08-30 DIAGNOSIS — W01198A Fall on same level from slipping, tripping and stumbling with subsequent striking against other object, initial encounter: Secondary | ICD-10-CM | POA: Diagnosis not present

## 2021-08-30 DIAGNOSIS — W19XXXA Unspecified fall, initial encounter: Secondary | ICD-10-CM | POA: Diagnosis not present

## 2021-08-30 DIAGNOSIS — I4891 Unspecified atrial fibrillation: Secondary | ICD-10-CM | POA: Diagnosis not present

## 2021-08-30 DIAGNOSIS — R58 Hemorrhage, not elsewhere classified: Secondary | ICD-10-CM | POA: Diagnosis not present

## 2021-08-30 DIAGNOSIS — S61402A Unspecified open wound of left hand, initial encounter: Secondary | ICD-10-CM | POA: Diagnosis not present

## 2021-08-30 DIAGNOSIS — Z79899 Other long term (current) drug therapy: Secondary | ICD-10-CM | POA: Diagnosis not present

## 2021-08-30 DIAGNOSIS — S61419A Laceration without foreign body of unspecified hand, initial encounter: Secondary | ICD-10-CM

## 2021-08-30 DIAGNOSIS — S0990XA Unspecified injury of head, initial encounter: Secondary | ICD-10-CM | POA: Diagnosis not present

## 2021-08-30 DIAGNOSIS — S61411A Laceration without foreign body of right hand, initial encounter: Secondary | ICD-10-CM | POA: Diagnosis not present

## 2021-08-30 DIAGNOSIS — Z743 Need for continuous supervision: Secondary | ICD-10-CM | POA: Diagnosis not present

## 2021-08-30 LAB — COMPREHENSIVE METABOLIC PANEL
ALT: 11 U/L (ref 0–44)
AST: 19 U/L (ref 15–41)
Albumin: 3.5 g/dL (ref 3.5–5.0)
Alkaline Phosphatase: 117 U/L (ref 38–126)
Anion gap: 8 (ref 5–15)
BUN: 53 mg/dL — ABNORMAL HIGH (ref 8–23)
CO2: 20 mmol/L — ABNORMAL LOW (ref 22–32)
Calcium: 9.2 mg/dL (ref 8.9–10.3)
Chloride: 109 mmol/L (ref 98–111)
Creatinine, Ser: 1.77 mg/dL — ABNORMAL HIGH (ref 0.44–1.00)
GFR, Estimated: 28 mL/min — ABNORMAL LOW (ref >60)
Glucose, Bld: 102 mg/dL — ABNORMAL HIGH (ref 70–99)
Potassium: 4.4 mmol/L (ref 3.5–5.1)
Sodium: 137 mmol/L (ref 135–145)
Total Bilirubin: 0.5 mg/dL (ref 0.3–1.2)
Total Protein: 6.6 g/dL (ref 6.5–8.1)

## 2021-08-30 LAB — CBC WITH DIFFERENTIAL/PLATELET
Abs Immature Granulocytes: 0.01 10*3/uL (ref 0.00–0.07)
Basophils Absolute: 0 10*3/uL (ref 0.0–0.1)
Basophils Relative: 0 %
Eosinophils Absolute: 0.1 10*3/uL (ref 0.0–0.5)
Eosinophils Relative: 2 %
HCT: 38.6 % (ref 36.0–46.0)
Hemoglobin: 12.3 g/dL (ref 12.0–15.0)
Immature Granulocytes: 0 %
Lymphocytes Relative: 28 %
Lymphs Abs: 1.8 10*3/uL (ref 0.7–4.0)
MCH: 29.5 pg (ref 26.0–34.0)
MCHC: 31.9 g/dL (ref 30.0–36.0)
MCV: 92.6 fL (ref 80.0–100.0)
Monocytes Absolute: 0.4 10*3/uL (ref 0.1–1.0)
Monocytes Relative: 6 %
Neutro Abs: 4 10*3/uL (ref 1.7–7.7)
Neutrophils Relative %: 64 %
Platelets: 51 10*3/uL — ABNORMAL LOW (ref 150–400)
RBC: 4.17 MIL/uL (ref 3.87–5.11)
RDW: 13.5 % (ref 11.5–15.5)
WBC: 6.3 10*3/uL (ref 4.0–10.5)
nRBC: 0 % (ref 0.0–0.2)

## 2021-08-30 LAB — TROPONIN I (HIGH SENSITIVITY)
Troponin I (High Sensitivity): 8 ng/L (ref <18)
Troponin I (High Sensitivity): 8 ng/L (ref <18)

## 2021-08-30 MED ORDER — MORPHINE SULFATE (PF) 4 MG/ML IV SOLN
4.0000 mg | Freq: Once | INTRAVENOUS | Status: AC
  Administered 2021-08-30: 20:00:00 4 mg via INTRAVENOUS
  Filled 2021-08-30: qty 1

## 2021-08-30 MED ORDER — SODIUM CHLORIDE 0.9 % IV BOLUS
1000.0000 mL | Freq: Once | INTRAVENOUS | Status: AC
  Administered 2021-08-30: 23:00:00 1000 mL via INTRAVENOUS

## 2021-08-30 MED ORDER — LIDOCAINE HCL 2 % IJ SOLN
10.0000 mL | Freq: Once | INTRAMUSCULAR | Status: DC
  Filled 2021-08-30: qty 20

## 2021-08-30 MED ORDER — TETANUS-DIPHTH-ACELL PERTUSSIS 5-2.5-18.5 LF-MCG/0.5 IM SUSY
0.5000 mL | PREFILLED_SYRINGE | Freq: Once | INTRAMUSCULAR | Status: AC
  Administered 2021-08-30: 20:00:00 0.5 mL via INTRAMUSCULAR
  Filled 2021-08-30: qty 0.5

## 2021-08-30 MED ORDER — OXYCODONE-ACETAMINOPHEN 5-325 MG PO TABS: 1.0000 | ORAL_TABLET | Freq: Four times a day (QID) | ORAL | 0 refills | Status: AC | PRN

## 2021-08-30 NOTE — ED Provider Notes (Signed)
[facility] EMERGENCY DEPARTMENT Provider Note   CSN: [id number] Arrival date & time: 08/30/21  1836     History Chief Complaint  Patient presents with   [facility] Jamiracle Avants is a 80 y.o. female hx of bipolar, dementia here presenting with a fall.  Patient does not usually walk at baseline and is wheelchair-bound.  However recently, her mental status has improved to she has been walking more.  She lives at home with her husband.  He states that she had a mechanical fall and tripped and fell and hit her head.  Patient also was noted to have abrasions on bilateral hands as well.  Unknown tetanus. History per husband, patient unable to give any history   The history is provided by the spouse.  Level V caveat- dementia     Past Medical History:  Diagnosis Date   Bipolar 1 disorder ([place])    Dementia ([place])    Obese     Patient Active Problem List   Diagnosis Date Noted   Dementia ([place]) 07/18/2020    Past Surgical History:  Procedure Laterality Date   ovarian resection     TOTAL HIP ARTHROPLASTY Left    Hip fracture     OB History   No obstetric history on file.     Family History  Problem Relation Age of Onset   Heart attack Father    Drug abuse Sister     Social History   Tobacco Use   Smoking status: Never   Smokeless tobacco: Never  Substance Use Topics   Alcohol use: Never   Drug use: Never    Home Medications Prior to Admission medications   Medication Sig Start Date End Date Taking? Authorizing Provider  ARIPiprazole (ABILIFY) 10 MG tablet Take 10 mg by mouth daily.    [provider]  aspirin 81 MG chewable tablet Chew by mouth.    [provider]  citalopram (CELEXA) 20 MG tablet Take by mouth.    [provider]  donepezil (ARICEPT) 10 MG tablet Take by mouth.    [provider]  ergocalciferol (VITAMIN D2) 1.25 MG (50000 UT) capsule Take 1 capsule by mouth once a week. 06/20/19    [provider]  fenofibrate (TRICOR) 145 MG tablet Take by mouth.    [provider]  loperamide (IMODIUM) 2 MG capsule Take 2 mg by mouth daily as needed. 06/25/20   [provider]  memantine (NAMENDA) 5 MG tablet TAKE ONE (1) TABLET ONCE DAILY 07/06/20   [provider]  metoprolol succinate (TOPROL-XL) 25 MG 24 hr tablet Take by mouth.    [provider]  omeprazole (PRILOSEC) 20 MG capsule Take by mouth.    [provider]  sertraline (ZOLOFT) 50 MG tablet Take 50 mg by mouth daily. 07/06/20   [provider]  tamsulosin (FLOMAX) 0.4 MG CAPS capsule Take 0.4 mg by mouth daily. 07/06/20   [provider]    Allergies    Patient has no known allergies.  Review of Systems   Review of Systems  Skin:  Positive for wound.  Psychiatric/Behavioral:  Positive for confusion.   All other systems reviewed and are negative.  Physical Exam Updated Vital Signs BP 97/67    Pulse 65    Temp 98.6 F (37 C) (Axillary)    Resp 14    Ht 5\' 3"  (1.6 m)    Wt 81 kg    SpO2 100%  BMI 31.63 kg/m   Physical Exam Vitals and nursing note reviewed.  Constitutional:      Comments: Demented  HENT:     Head:     Comments: 3 cm laceration on the right scalp    Nose: Nose normal.     Mouth/Throat:     Mouth: Mucous membranes are moist.  Eyes:     Extraocular Movements: Extraocular movements intact.     Pupils: Pupils are equal, round, and reactive to light.  Cardiovascular:     Rate and Rhythm: Normal rate and regular rhythm.     Pulses: Normal pulses.     Heart sounds: Normal heart sounds.  Pulmonary:     Effort: Pulmonary effort is normal.     Breath sounds: Normal breath sounds.  Abdominal:     General: Abdomen is flat.     Palpations: Abdomen is soft.  Musculoskeletal:     Cervical back: Normal range of motion.     Comments: Patient has 10 cm skin tear in the dorsum of the right hand.  Patient also has a 10 cm skin  tear in the left hand.  Patient has skin tear in the left hand over the first webspace.  There is some tendons exposed but patient is able to flex and extend the tendons and the tendons are intact.  Patient has multiple bruising on her forearm from previous falls.  Neurological:     Comments: Demented, contracted but moving all extremities  Psychiatric:     Comments: Unable     ED Results / Procedures / Treatments   Labs (all labs ordered are listed, but only abnormal results are displayed) Labs Reviewed  CBC WITH DIFFERENTIAL/PLATELET - Abnormal; Notable for the following components:      Result Value   Platelets 51 (*)    All other components within normal limits  COMPREHENSIVE METABOLIC PANEL - Abnormal; Notable for the following components:   CO2 20 (*)    Glucose, Bld 102 (*)    BUN 53 (*)    Creatinine, Ser 1.77 (*)    GFR, Estimated 28 (*)    All other components within normal limits  URINALYSIS, ROUTINE W REFLEX MICROSCOPIC  TROPONIN I (HIGH SENSITIVITY)  TROPONIN I (HIGH SENSITIVITY)    EKG EKG Interpretation  Date/Time:  Saturday August 30 2021 19:03:31 EST Ventricular Rate:  73 PR Interval:    QRS Duration: 116 QT Interval:  394 QTC Calculation: 435 R Axis:   -62 Text Interpretation: Atrial fibrillation Left anterior fascicular block Probable anterior infarct, age indeterminate Artifact in lead(s) I II III aVR aVL aVF V1 V2 V3 V4 V5 V6 No significant change since last tracing Confirmed by Wandra Arthurs [phone]) on 08/30/2021 7:09:01 PM  Radiology DG Chest 1 View  Result Date: 08/30/2021 CLINICAL DATA:  fall EXAM: CHEST  1 VIEW COMPARISON:  June 19, 2021 FINDINGS: The cardiomediastinal silhouette is unchanged in contour.Low lung volumes. No pleural effusion. No pneumothorax. LEFT basilar platelike opacity, likely atelectasis. Visualized abdomen is unremarkable. IMPRESSION: LEFT basilar platelike opacity, favored atelectasis. Electronically Signed   By:  Valentino Saxon M.D.   On: 08/30/2021 19:51   DG Pelvis 1-2 Views  Result Date: 08/30/2021 CLINICAL DATA:  fall EXAM: PELVIS - 1-2 VIEW COMPARISON:  None. FINDINGS: Evaluation is limited by underpenetration. Status post pinning of the LEFT femur. No pelvic diastasis. No definitive acute fracture or dislocation within this limited exam. Degenerative changes of the lower lumbar spine. Limited assessment of  the sacrum secondary to overlapping bowel contents. IMPRESSION: No definitive acute fracture or dislocation. If persistent concern, recommend additional views or cross-sectional imaging. Electronically Signed   By: Valentino Saxon M.D.   On: 08/30/2021 20:04   DG Forearm Right  Result Date: 08/30/2021 CLINICAL DATA:  fall EXAM: RIGHT FOREARM - 2 VIEW; RIGHT HAND - COMPLETE 3+ VIEW COMPARISON:  None. FINDINGS: Osteopenia. No definitive acute fracture or dislocation. Advanced degenerative changes of the first [facility] with extensive osseous remodeling. Degenerative changes of the DIPs. Chondrocalcinosis. Degenerative changes of the radiocarpal joint. No unexpected radiopaque foreign body. IMPRESSION: 1. No definitive acute fracture or dislocation of the RIGHT hand or forearm. If persistent concern, recommend immobilization with repeat imaging in 2 weeks. If persistent clinical concern for scaphoid fracture, recommend immobilization and follow-up radiographs in 2 weeks versus CT or MRI. Electronically Signed   By: Valentino Saxon M.D.   On: 08/30/2021 19:58   CT HEAD WO CONTRAST (5MM)  Result Date: 08/30/2021 CLINICAL DATA:  Head trauma, intracranial arterial injury suspected; Neck trauma (Age >= 65y) EXAM: CT HEAD WITHOUT CONTRAST CT CERVICAL SPINE WITHOUT CONTRAST TECHNIQUE: Multidetector CT imaging of the head and cervical spine was performed following the standard protocol without intravenous contrast. Multiplanar CT image reconstructions of the cervical spine were also generated. COMPARISON:   September 29, 2019 FINDINGS: Evaluation is limited secondary to motion. CT HEAD FINDINGS Brain: No evidence of acute infarction, hemorrhage, hydrocephalus, extra-axial collection or mass lesion/mass effect. Periventricular white matter hypodensities consistent with sequela of chronic microvascular ischemic disease. Severe global parenchymal volume loss with prominent volume loss of the temporal lobes. Vascular: Vascular calcifications of the carotid siphons. Skull: No acute fracture. Sinuses/Orbits: No acute finding. Other: None. CT CERVICAL SPINE FINDINGS Alignment: No spondylolisthesis or static subluxation. Exaggerated kyphosis at the cervicothoracic junction. Skull base and vertebrae: No acute fracture. No primary bone lesion or focal pathologic process. Soft tissues and spinal canal: No prevertebral fluid or swelling. No visible canal hematoma. Disc levels: Multilevel intervertebral disc space height loss most pronounced at C5-6 and C6-7. Multilevel facet arthropathy and uncovertebral hypertrophy results in multilevel osseous neuroforaminal narrowing most pronounced at C5-6. Upper chest: LEFT upper lobe atelectasis. Other: Atherosclerotic calcifications of the carotid bulbs. IMPRESSION: 1.  No acute intracranial abnormality. 2.  No acute fracture or static subluxation of the cervical spine. Electronically Signed   By: Valentino Saxon M.D.   On: 08/30/2021 19:50   CT Cervical Spine Wo Contrast  Result Date: 08/30/2021 CLINICAL DATA:  Head trauma, intracranial arterial injury suspected; Neck trauma (Age >= 65y) EXAM: CT HEAD WITHOUT CONTRAST CT CERVICAL SPINE WITHOUT CONTRAST TECHNIQUE: Multidetector CT imaging of the head and cervical spine was performed following the standard protocol without intravenous contrast. Multiplanar CT image reconstructions of the cervical spine were also generated. COMPARISON:  September 29, 2019 FINDINGS: Evaluation is limited secondary to motion. CT HEAD FINDINGS Brain: No  evidence of acute infarction, hemorrhage, hydrocephalus, extra-axial collection or mass lesion/mass effect. Periventricular white matter hypodensities consistent with sequela of chronic microvascular ischemic disease. Severe global parenchymal volume loss with prominent volume loss of the temporal lobes. Vascular: Vascular calcifications of the carotid siphons. Skull: No acute fracture. Sinuses/Orbits: No acute finding. Other: None. CT CERVICAL SPINE FINDINGS Alignment: No spondylolisthesis or static subluxation. Exaggerated kyphosis at the cervicothoracic junction. Skull base and vertebrae: No acute fracture. No primary bone lesion or focal pathologic process. Soft tissues and spinal canal: No prevertebral fluid or swelling. No visible canal hematoma. Disc  levels: Multilevel intervertebral disc space height loss most pronounced at C5-6 and C6-7. Multilevel facet arthropathy and uncovertebral hypertrophy results in multilevel osseous neuroforaminal narrowing most pronounced at C5-6. Upper chest: LEFT upper lobe atelectasis. Other: Atherosclerotic calcifications of the carotid bulbs. IMPRESSION: 1.  No acute intracranial abnormality. 2.  No acute fracture or static subluxation of the cervical spine. Electronically Signed   By: Valentino Saxon M.D.   On: 08/30/2021 19:50   DG Hand Complete Left  Result Date: 08/30/2021 CLINICAL DATA:  fall EXAM: LEFT HAND - COMPLETE 3+ VIEW COMPARISON:  None. FINDINGS: Osteopenia. No definitive acute fracture or dislocation. Chronic appearing bone-on-bone apposition of the second PIP with ulnar subluxation of the distal finger; this could reflect sequela of inflammatory arthritis . Advanced degenerative changes of the first [place]. Chondrocalcinosis. Well corticated ossific density adjacent to the radial head consistent with sequela of remote prior trauma. Widening of the scapholunate space likely reflecting sequela of remote prior trauma or inflammatory arthropathy. Degenerative  changes of the DIPs. IMPRESSION: 1. No definitive acute fracture or dislocation. 2. Widening of the scapholunate interval is likely chronic in nature. 3. Osseous changes suggesting underlying inflammatory arthropathy. If persistent clinical concern for scaphoid fracture, recommend immobilization and follow-up radiographs in 2 weeks versus MRI. Electronically Signed   By: Valentino Saxon M.D.   On: 08/30/2021 20:05   DG Hand Complete Right  Result Date: 08/30/2021 CLINICAL DATA:  fall EXAM: RIGHT FOREARM - 2 VIEW; RIGHT HAND - COMPLETE 3+ VIEW COMPARISON:  None. FINDINGS: Osteopenia. No definitive acute fracture or dislocation. Advanced degenerative changes of the first [facility] with extensive osseous remodeling. Degenerative changes of the DIPs. Chondrocalcinosis. Degenerative changes of the radiocarpal joint. No unexpected radiopaque foreign body. IMPRESSION: 1. No definitive acute fracture or dislocation of the RIGHT hand or forearm. If persistent concern, recommend immobilization with repeat imaging in 2 weeks. If persistent clinical concern for scaphoid fracture, recommend immobilization and follow-up radiographs in 2 weeks versus CT or MRI. Electronically Signed   By: Valentino Saxon M.D.   On: 08/30/2021 19:58    Procedures Procedures   LACERATION REPAIR Performed by: Wandra Arthurs Authorized by: Wandra Arthurs Consent: Verbal consent obtained. Risks and benefits: risks, benefits and alternatives were discussed Consent given by: patient Patient identity confirmed: provided demographic data Prepped and Draped in normal sterile fashion Wound explored  Laceration Location: L hand   Laceration Length: 10 cm  No Foreign Bodies seen or palpated  Anesthesia: none  Irrigation method: syringe Amount of cleaning: standard  Skin closure: steri strips    Patient tolerance: Patient tolerated the procedure well with no immediate complications.  LACERATION REPAIR Performed by: Wandra Arthurs Authorized by: Wandra Arthurs Consent: Verbal consent obtained. Risks and benefits: risks, benefits and alternatives were discussed Consent given by: patient Patient identity confirmed: provided demographic data Prepped and Draped in normal sterile fashion Wound explored  Laceration Location: R scalp   Laceration Length: 3 cm  No Foreign Bodies seen or palpated  Anesthesia: none    Irrigation method: syringe Amount of cleaning: standard  Skin closure: staples   Number of staples: 4   Patient tolerance: Patient tolerated the procedure well with no immediate complications.   Medications Ordered in ED Medications  lidocaine (XYLOCAINE) 2 % (with pres) injection 200 mg (has no administration in time range)  morphine 4 MG/ML injection 4 mg (4 mg Intravenous Given 08/30/21 2012)  Tdap (BOOSTRIX) injection 0.5 mL (0.5 mLs Intramuscular Given  08/30/21 2014)  sodium chloride 0.9 % bolus 1,000 mL (1,000 mLs Intravenous New Bag/Given 08/30/21 2237)    ED Course  I have reviewed the triage vital signs and the nursing notes.  Pertinent labs & imaging results that were available during my care of the patient were reviewed by me and considered in my medical decision making (see chart for details).    MDM Rules/Calculators/A&P                         Latonja Ahliya Glatt is a 80 y.o. female here presenting with fall.  Patient had a mechanical fall.  Patient has a scalp laceration.  Patient also has multiple skin tears on bilateral hands. Plan to get CT head and neck and labs and hand x-rays.  11:02 PM Patient has mild AKI with creatinine is 1.7.  Patient's troponin negative x2.  CT head and neck unremarkable.  Extremity x-ray showed no fracture.  I was able to staple the scalp laceration and Steri-Stripped the left hand skin tear.  Will discharge patient home with some Percocet as needed for pain.  We will have her follow-up with her doctor and get a repeat BMP in a week.  She also  needs staple removal in a week     Final Clinical Impression(s) / ED Diagnoses Final diagnoses:  Scalp laceration, initial encounter  Skin tear of hand without complication, unspecified laterality, initial encounter  AKI (acute kidney injury) [facility])    Rx / [place] Orders ED Discharge Orders     None        Drenda Freeze, MD 08/30/21 2303

## 2021-08-30 NOTE — ED Notes (Signed)
Pt confused, somewhat agitated. Hx dementia, very confused, awaiting husband arrival. Bleeding controlled to forehead lac. Bilateral skin tears covered with gauze from EMS. Awaiting Dr Darl Householder.

## 2021-08-30 NOTE — ED Triage Notes (Signed)
BIB [facility] for witnessed fall, husband saw pt trip and pt fell. Did hit head, has edema, lac to R forehead and skin tear to bitleral hands. EMS reports L hand skin tear to have visible tendon. Pt with hx dementia and bipolar.

## 2021-08-30 NOTE — Discharge Instructions (Addendum)
You have a scalp laceration.  I have placed staples on the right scalp.  You need staple removal in a week  Take Tylenol as needed for pain.  Take percocet for severe pain   Your kidney function is slightly abnormal.  You will need to stay hydrated and repeat kidney function in a week  You will likely have some bruising of your hands.  You may remove the dressing in 3 days.  Return to ER if you have uncontrolled bleeding, vomiting, severe pain

## 2021-09-02 DIAGNOSIS — F02818 Dementia in other diseases classified elsewhere, unspecified severity, with other behavioral disturbance: Secondary | ICD-10-CM | POA: Diagnosis not present

## 2021-09-02 DIAGNOSIS — S61419D Laceration without foreign body of unspecified hand, subsequent encounter: Secondary | ICD-10-CM | POA: Diagnosis not present

## 2021-09-02 DIAGNOSIS — G301 Alzheimer's disease with late onset: Secondary | ICD-10-CM | POA: Diagnosis not present

## 2021-09-02 DIAGNOSIS — I10 Essential (primary) hypertension: Secondary | ICD-10-CM | POA: Diagnosis not present

## 2021-09-02 DIAGNOSIS — N179 Acute kidney failure, unspecified: Secondary | ICD-10-CM | POA: Diagnosis not present

## 2021-09-02 DIAGNOSIS — S0101XA Laceration without foreign body of scalp, initial encounter: Secondary | ICD-10-CM | POA: Diagnosis not present

## 2021-09-02 DIAGNOSIS — W19XXXD Unspecified fall, subsequent encounter: Secondary | ICD-10-CM | POA: Diagnosis not present

## 2021-09-02 DIAGNOSIS — N189 Chronic kidney disease, unspecified: Secondary | ICD-10-CM | POA: Diagnosis not present

## 2021-09-02 DIAGNOSIS — R5381 Other malaise: Secondary | ICD-10-CM | POA: Diagnosis not present

## 2021-09-02 DIAGNOSIS — D696 Thrombocytopenia, unspecified: Secondary | ICD-10-CM | POA: Diagnosis not present

## 2021-09-05 ENCOUNTER — Telehealth: Payer: Self-pay | Admitting: *Deleted

## 2021-09-05 NOTE — Telephone Encounter (Signed)
Pt son, Nicole Kindred called regarding when his mom should have her staples removed and hand dressing replaced.  RNCM reviewed chart to find that the staples should be removed after 1 week and the hand dressing could be removed/redressed after 3 days.  RNCM left message on Tony's voicemail with instructions and callback number if he had any additional questions.

## 2021-09-10 DIAGNOSIS — G301 Alzheimer's disease with late onset: Secondary | ICD-10-CM | POA: Diagnosis not present

## 2021-09-10 DIAGNOSIS — D696 Thrombocytopenia, unspecified: Secondary | ICD-10-CM | POA: Diagnosis not present

## 2021-09-10 DIAGNOSIS — Z23 Encounter for immunization: Secondary | ICD-10-CM | POA: Diagnosis not present

## 2021-09-10 DIAGNOSIS — W19XXXD Unspecified fall, subsequent encounter: Secondary | ICD-10-CM | POA: Diagnosis not present

## 2021-09-10 DIAGNOSIS — S0101XD Laceration without foreign body of scalp, subsequent encounter: Secondary | ICD-10-CM | POA: Diagnosis not present

## 2021-09-10 DIAGNOSIS — T148XXA Other injury of unspecified body region, initial encounter: Secondary | ICD-10-CM | POA: Diagnosis not present

## 2021-09-10 DIAGNOSIS — F02818 Dementia in other diseases classified elsewhere, unspecified severity, with other behavioral disturbance: Secondary | ICD-10-CM | POA: Diagnosis not present

## 2021-09-10 DIAGNOSIS — Z9181 History of falling: Secondary | ICD-10-CM | POA: Diagnosis not present

## 2021-09-17 DIAGNOSIS — Z9181 History of falling: Secondary | ICD-10-CM | POA: Diagnosis not present

## 2021-09-17 DIAGNOSIS — S51811D Laceration without foreign body of right forearm, subsequent encounter: Secondary | ICD-10-CM | POA: Diagnosis not present

## 2021-09-17 DIAGNOSIS — N189 Chronic kidney disease, unspecified: Secondary | ICD-10-CM | POA: Diagnosis not present

## 2021-09-17 DIAGNOSIS — S61412D Laceration without foreign body of left hand, subsequent encounter: Secondary | ICD-10-CM | POA: Diagnosis not present

## 2021-09-17 DIAGNOSIS — D696 Thrombocytopenia, unspecified: Secondary | ICD-10-CM | POA: Diagnosis not present

## 2021-09-17 DIAGNOSIS — Z7982 Long term (current) use of aspirin: Secondary | ICD-10-CM | POA: Diagnosis not present

## 2021-09-17 DIAGNOSIS — G301 Alzheimer's disease with late onset: Secondary | ICD-10-CM | POA: Diagnosis not present

## 2021-09-17 DIAGNOSIS — I129 Hypertensive chronic kidney disease with stage 1 through stage 4 chronic kidney disease, or unspecified chronic kidney disease: Secondary | ICD-10-CM | POA: Diagnosis not present

## 2021-09-17 DIAGNOSIS — N179 Acute kidney failure, unspecified: Secondary | ICD-10-CM | POA: Diagnosis not present

## 2021-09-17 DIAGNOSIS — F0283 Dementia in other diseases classified elsewhere, unspecified severity, with mood disturbance: Secondary | ICD-10-CM | POA: Diagnosis not present

## 2021-09-17 DIAGNOSIS — S0101XD Laceration without foreign body of scalp, subsequent encounter: Secondary | ICD-10-CM | POA: Diagnosis not present

## 2021-09-19 DIAGNOSIS — S0101XD Laceration without foreign body of scalp, subsequent encounter: Secondary | ICD-10-CM | POA: Diagnosis not present

## 2021-09-19 DIAGNOSIS — D696 Thrombocytopenia, unspecified: Secondary | ICD-10-CM | POA: Diagnosis not present

## 2021-09-19 DIAGNOSIS — Z7982 Long term (current) use of aspirin: Secondary | ICD-10-CM | POA: Diagnosis not present

## 2021-09-19 DIAGNOSIS — S51811D Laceration without foreign body of right forearm, subsequent encounter: Secondary | ICD-10-CM | POA: Diagnosis not present

## 2021-09-19 DIAGNOSIS — N179 Acute kidney failure, unspecified: Secondary | ICD-10-CM | POA: Diagnosis not present

## 2021-09-19 DIAGNOSIS — G301 Alzheimer's disease with late onset: Secondary | ICD-10-CM | POA: Diagnosis not present

## 2021-09-19 DIAGNOSIS — Z9181 History of falling: Secondary | ICD-10-CM | POA: Diagnosis not present

## 2021-09-19 DIAGNOSIS — I129 Hypertensive chronic kidney disease with stage 1 through stage 4 chronic kidney disease, or unspecified chronic kidney disease: Secondary | ICD-10-CM | POA: Diagnosis not present

## 2021-09-19 DIAGNOSIS — N189 Chronic kidney disease, unspecified: Secondary | ICD-10-CM | POA: Diagnosis not present

## 2021-09-19 DIAGNOSIS — F0283 Dementia in other diseases classified elsewhere, unspecified severity, with mood disturbance: Secondary | ICD-10-CM | POA: Diagnosis not present

## 2021-09-19 DIAGNOSIS — S61412D Laceration without foreign body of left hand, subsequent encounter: Secondary | ICD-10-CM | POA: Diagnosis not present

## 2021-09-22 DIAGNOSIS — Z9181 History of falling: Secondary | ICD-10-CM | POA: Diagnosis not present

## 2021-09-22 DIAGNOSIS — S51811D Laceration without foreign body of right forearm, subsequent encounter: Secondary | ICD-10-CM | POA: Diagnosis not present

## 2021-09-22 DIAGNOSIS — I129 Hypertensive chronic kidney disease with stage 1 through stage 4 chronic kidney disease, or unspecified chronic kidney disease: Secondary | ICD-10-CM | POA: Diagnosis not present

## 2021-09-22 DIAGNOSIS — S0101XD Laceration without foreign body of scalp, subsequent encounter: Secondary | ICD-10-CM | POA: Diagnosis not present

## 2021-09-22 DIAGNOSIS — Z7982 Long term (current) use of aspirin: Secondary | ICD-10-CM | POA: Diagnosis not present

## 2021-09-22 DIAGNOSIS — S61412D Laceration without foreign body of left hand, subsequent encounter: Secondary | ICD-10-CM | POA: Diagnosis not present

## 2021-09-22 DIAGNOSIS — N179 Acute kidney failure, unspecified: Secondary | ICD-10-CM | POA: Diagnosis not present

## 2021-09-22 DIAGNOSIS — D696 Thrombocytopenia, unspecified: Secondary | ICD-10-CM | POA: Diagnosis not present

## 2021-09-22 DIAGNOSIS — G301 Alzheimer's disease with late onset: Secondary | ICD-10-CM | POA: Diagnosis not present

## 2021-09-22 DIAGNOSIS — N189 Chronic kidney disease, unspecified: Secondary | ICD-10-CM | POA: Diagnosis not present

## 2021-09-22 DIAGNOSIS — F0283 Dementia in other diseases classified elsewhere, unspecified severity, with mood disturbance: Secondary | ICD-10-CM | POA: Diagnosis not present

## 2021-09-23 DIAGNOSIS — S0101XD Laceration without foreign body of scalp, subsequent encounter: Secondary | ICD-10-CM | POA: Diagnosis not present

## 2021-09-23 DIAGNOSIS — G301 Alzheimer's disease with late onset: Secondary | ICD-10-CM | POA: Diagnosis not present

## 2021-09-23 DIAGNOSIS — F02818 Dementia in other diseases classified elsewhere, unspecified severity, with other behavioral disturbance: Secondary | ICD-10-CM | POA: Diagnosis not present

## 2021-09-23 DIAGNOSIS — T148XXA Other injury of unspecified body region, initial encounter: Secondary | ICD-10-CM | POA: Diagnosis not present

## 2021-09-26 DIAGNOSIS — F0283 Dementia in other diseases classified elsewhere, unspecified severity, with mood disturbance: Secondary | ICD-10-CM | POA: Diagnosis not present

## 2021-09-26 DIAGNOSIS — I129 Hypertensive chronic kidney disease with stage 1 through stage 4 chronic kidney disease, or unspecified chronic kidney disease: Secondary | ICD-10-CM | POA: Diagnosis not present

## 2021-09-26 DIAGNOSIS — N179 Acute kidney failure, unspecified: Secondary | ICD-10-CM | POA: Diagnosis not present

## 2021-09-26 DIAGNOSIS — Z9181 History of falling: Secondary | ICD-10-CM | POA: Diagnosis not present

## 2021-09-26 DIAGNOSIS — Z7982 Long term (current) use of aspirin: Secondary | ICD-10-CM | POA: Diagnosis not present

## 2021-09-26 DIAGNOSIS — D696 Thrombocytopenia, unspecified: Secondary | ICD-10-CM | POA: Diagnosis not present

## 2021-09-26 DIAGNOSIS — S51811D Laceration without foreign body of right forearm, subsequent encounter: Secondary | ICD-10-CM | POA: Diagnosis not present

## 2021-09-26 DIAGNOSIS — S61412D Laceration without foreign body of left hand, subsequent encounter: Secondary | ICD-10-CM | POA: Diagnosis not present

## 2021-09-26 DIAGNOSIS — N189 Chronic kidney disease, unspecified: Secondary | ICD-10-CM | POA: Diagnosis not present

## 2021-09-26 DIAGNOSIS — G301 Alzheimer's disease with late onset: Secondary | ICD-10-CM | POA: Diagnosis not present

## 2021-09-26 DIAGNOSIS — S0101XD Laceration without foreign body of scalp, subsequent encounter: Secondary | ICD-10-CM | POA: Diagnosis not present

## 2021-10-01 DIAGNOSIS — S61412D Laceration without foreign body of left hand, subsequent encounter: Secondary | ICD-10-CM | POA: Diagnosis not present

## 2021-10-01 DIAGNOSIS — G301 Alzheimer's disease with late onset: Secondary | ICD-10-CM | POA: Diagnosis not present

## 2021-10-01 DIAGNOSIS — Z7982 Long term (current) use of aspirin: Secondary | ICD-10-CM | POA: Diagnosis not present

## 2021-10-01 DIAGNOSIS — N189 Chronic kidney disease, unspecified: Secondary | ICD-10-CM | POA: Diagnosis not present

## 2021-10-01 DIAGNOSIS — N179 Acute kidney failure, unspecified: Secondary | ICD-10-CM | POA: Diagnosis not present

## 2021-10-01 DIAGNOSIS — S51811D Laceration without foreign body of right forearm, subsequent encounter: Secondary | ICD-10-CM | POA: Diagnosis not present

## 2021-10-01 DIAGNOSIS — I129 Hypertensive chronic kidney disease with stage 1 through stage 4 chronic kidney disease, or unspecified chronic kidney disease: Secondary | ICD-10-CM | POA: Diagnosis not present

## 2021-10-01 DIAGNOSIS — D696 Thrombocytopenia, unspecified: Secondary | ICD-10-CM | POA: Diagnosis not present

## 2021-10-01 DIAGNOSIS — Z9181 History of falling: Secondary | ICD-10-CM | POA: Diagnosis not present

## 2021-10-01 DIAGNOSIS — S0101XD Laceration without foreign body of scalp, subsequent encounter: Secondary | ICD-10-CM | POA: Diagnosis not present

## 2021-10-01 DIAGNOSIS — F0283 Dementia in other diseases classified elsewhere, unspecified severity, with mood disturbance: Secondary | ICD-10-CM | POA: Diagnosis not present

## 2021-10-09 DIAGNOSIS — S61412D Laceration without foreign body of left hand, subsequent encounter: Secondary | ICD-10-CM | POA: Diagnosis not present

## 2021-10-09 DIAGNOSIS — S51811D Laceration without foreign body of right forearm, subsequent encounter: Secondary | ICD-10-CM | POA: Diagnosis not present

## 2021-10-09 DIAGNOSIS — G301 Alzheimer's disease with late onset: Secondary | ICD-10-CM | POA: Diagnosis not present

## 2021-10-09 DIAGNOSIS — S0101XD Laceration without foreign body of scalp, subsequent encounter: Secondary | ICD-10-CM | POA: Diagnosis not present

## 2021-10-09 DIAGNOSIS — N179 Acute kidney failure, unspecified: Secondary | ICD-10-CM | POA: Diagnosis not present

## 2021-10-09 DIAGNOSIS — Z7982 Long term (current) use of aspirin: Secondary | ICD-10-CM | POA: Diagnosis not present

## 2021-10-09 DIAGNOSIS — D696 Thrombocytopenia, unspecified: Secondary | ICD-10-CM | POA: Diagnosis not present

## 2021-10-09 DIAGNOSIS — Z9181 History of falling: Secondary | ICD-10-CM | POA: Diagnosis not present

## 2021-10-09 DIAGNOSIS — N189 Chronic kidney disease, unspecified: Secondary | ICD-10-CM | POA: Diagnosis not present

## 2021-10-09 DIAGNOSIS — F0283 Dementia in other diseases classified elsewhere, unspecified severity, with mood disturbance: Secondary | ICD-10-CM | POA: Diagnosis not present

## 2021-10-09 DIAGNOSIS — I129 Hypertensive chronic kidney disease with stage 1 through stage 4 chronic kidney disease, or unspecified chronic kidney disease: Secondary | ICD-10-CM | POA: Diagnosis not present

## 2021-10-17 DIAGNOSIS — S0101XD Laceration without foreign body of scalp, subsequent encounter: Secondary | ICD-10-CM | POA: Diagnosis not present

## 2021-10-17 DIAGNOSIS — Z9181 History of falling: Secondary | ICD-10-CM | POA: Diagnosis not present

## 2021-10-17 DIAGNOSIS — G301 Alzheimer's disease with late onset: Secondary | ICD-10-CM | POA: Diagnosis not present

## 2021-10-17 DIAGNOSIS — D696 Thrombocytopenia, unspecified: Secondary | ICD-10-CM | POA: Diagnosis not present

## 2021-10-17 DIAGNOSIS — I129 Hypertensive chronic kidney disease with stage 1 through stage 4 chronic kidney disease, or unspecified chronic kidney disease: Secondary | ICD-10-CM | POA: Diagnosis not present

## 2021-10-17 DIAGNOSIS — S61412D Laceration without foreign body of left hand, subsequent encounter: Secondary | ICD-10-CM | POA: Diagnosis not present

## 2021-10-17 DIAGNOSIS — N179 Acute kidney failure, unspecified: Secondary | ICD-10-CM | POA: Diagnosis not present

## 2021-10-17 DIAGNOSIS — Z7982 Long term (current) use of aspirin: Secondary | ICD-10-CM | POA: Diagnosis not present

## 2021-10-17 DIAGNOSIS — N189 Chronic kidney disease, unspecified: Secondary | ICD-10-CM | POA: Diagnosis not present

## 2021-10-17 DIAGNOSIS — S51811D Laceration without foreign body of right forearm, subsequent encounter: Secondary | ICD-10-CM | POA: Diagnosis not present

## 2021-10-17 DIAGNOSIS — F0283 Dementia in other diseases classified elsewhere, unspecified severity, with mood disturbance: Secondary | ICD-10-CM | POA: Diagnosis not present

## 2021-10-31 DIAGNOSIS — Z20822 Contact with and (suspected) exposure to covid-19: Secondary | ICD-10-CM | POA: Diagnosis not present

## 2021-10-31 DIAGNOSIS — R410 Disorientation, unspecified: Secondary | ICD-10-CM | POA: Diagnosis not present

## 2021-10-31 DIAGNOSIS — R112 Nausea with vomiting, unspecified: Secondary | ICD-10-CM | POA: Diagnosis not present

## 2021-10-31 DIAGNOSIS — E860 Dehydration: Secondary | ICD-10-CM | POA: Diagnosis not present

## 2021-10-31 DIAGNOSIS — R1110 Vomiting, unspecified: Secondary | ICD-10-CM | POA: Diagnosis not present

## 2021-10-31 DIAGNOSIS — N390 Urinary tract infection, site not specified: Secondary | ICD-10-CM | POA: Diagnosis not present

## 2021-11-06 DIAGNOSIS — N179 Acute kidney failure, unspecified: Secondary | ICD-10-CM | POA: Diagnosis not present

## 2021-11-06 DIAGNOSIS — E861 Hypovolemia: Secondary | ICD-10-CM | POA: Diagnosis not present

## 2021-11-06 DIAGNOSIS — F02818 Dementia in other diseases classified elsewhere, unspecified severity, with other behavioral disturbance: Secondary | ICD-10-CM | POA: Diagnosis not present

## 2021-11-06 DIAGNOSIS — G301 Alzheimer's disease with late onset: Secondary | ICD-10-CM | POA: Diagnosis not present

## 2021-11-06 DIAGNOSIS — N1832 Chronic kidney disease, stage 3b: Secondary | ICD-10-CM | POA: Diagnosis not present

## 2021-12-08 DIAGNOSIS — K8689 Other specified diseases of pancreas: Secondary | ICD-10-CM | POA: Diagnosis not present

## 2021-12-08 DIAGNOSIS — J90 Pleural effusion, not elsewhere classified: Secondary | ICD-10-CM | POA: Diagnosis not present

## 2021-12-08 DIAGNOSIS — K838 Other specified diseases of biliary tract: Secondary | ICD-10-CM | POA: Diagnosis not present

## 2021-12-08 DIAGNOSIS — M47816 Spondylosis without myelopathy or radiculopathy, lumbar region: Secondary | ICD-10-CM | POA: Diagnosis not present

## 2021-12-08 DIAGNOSIS — I77810 Thoracic aortic ectasia: Secondary | ICD-10-CM | POA: Diagnosis not present

## 2021-12-08 DIAGNOSIS — N3000 Acute cystitis without hematuria: Secondary | ICD-10-CM | POA: Diagnosis not present

## 2021-12-17 DIAGNOSIS — N179 Acute kidney failure, unspecified: Secondary | ICD-10-CM | POA: Diagnosis not present

## 2021-12-17 DIAGNOSIS — Z0000 Encounter for general adult medical examination without abnormal findings: Secondary | ICD-10-CM | POA: Diagnosis not present

## 2021-12-17 DIAGNOSIS — Z13220 Encounter for screening for lipoid disorders: Secondary | ICD-10-CM | POA: Diagnosis not present

## 2021-12-17 DIAGNOSIS — G301 Alzheimer's disease with late onset: Secondary | ICD-10-CM | POA: Diagnosis not present

## 2021-12-17 DIAGNOSIS — N189 Chronic kidney disease, unspecified: Secondary | ICD-10-CM | POA: Diagnosis not present

## 2021-12-17 DIAGNOSIS — N3000 Acute cystitis without hematuria: Secondary | ICD-10-CM | POA: Diagnosis not present

## 2021-12-17 DIAGNOSIS — F02818 Dementia in other diseases classified elsewhere, unspecified severity, with other behavioral disturbance: Secondary | ICD-10-CM | POA: Diagnosis not present

## 2021-12-17 DIAGNOSIS — Z131 Encounter for screening for diabetes mellitus: Secondary | ICD-10-CM | POA: Diagnosis not present

## 2021-12-17 DIAGNOSIS — N184 Chronic kidney disease, stage 4 (severe): Secondary | ICD-10-CM | POA: Diagnosis not present

## 2021-12-17 DIAGNOSIS — S81812A Laceration without foreign body, left lower leg, initial encounter: Secondary | ICD-10-CM | POA: Diagnosis not present

## 2022-01-06 DIAGNOSIS — W19XXXD Unspecified fall, subsequent encounter: Secondary | ICD-10-CM | POA: Diagnosis not present

## 2022-01-06 DIAGNOSIS — K219 Gastro-esophageal reflux disease without esophagitis: Secondary | ICD-10-CM | POA: Diagnosis not present

## 2022-01-06 DIAGNOSIS — S81811D Laceration without foreign body, right lower leg, subsequent encounter: Secondary | ICD-10-CM | POA: Diagnosis not present

## 2022-01-06 DIAGNOSIS — R21 Rash and other nonspecific skin eruption: Secondary | ICD-10-CM | POA: Diagnosis not present

## 2022-01-06 DIAGNOSIS — G301 Alzheimer's disease with late onset: Secondary | ICD-10-CM | POA: Diagnosis not present

## 2022-01-06 DIAGNOSIS — F02818 Dementia in other diseases classified elsewhere, unspecified severity, with other behavioral disturbance: Secondary | ICD-10-CM | POA: Diagnosis not present

## 2022-02-22 DIAGNOSIS — A419 Sepsis, unspecified organism: Secondary | ICD-10-CM | POA: Diagnosis not present

## 2022-02-22 DIAGNOSIS — K219 Gastro-esophageal reflux disease without esophagitis: Secondary | ICD-10-CM | POA: Diagnosis not present

## 2022-02-22 DIAGNOSIS — Z885 Allergy status to narcotic agent status: Secondary | ICD-10-CM | POA: Diagnosis not present

## 2022-02-22 DIAGNOSIS — D631 Anemia in chronic kidney disease: Secondary | ICD-10-CM | POA: Diagnosis not present

## 2022-02-22 DIAGNOSIS — E7800 Pure hypercholesterolemia, unspecified: Secondary | ICD-10-CM | POA: Diagnosis not present

## 2022-02-22 DIAGNOSIS — G20 Parkinson's disease: Secondary | ICD-10-CM | POA: Diagnosis not present

## 2022-02-22 DIAGNOSIS — Z66 Do not resuscitate: Secondary | ICD-10-CM | POA: Diagnosis not present

## 2022-02-22 DIAGNOSIS — E8729 Other acidosis: Secondary | ICD-10-CM | POA: Diagnosis not present

## 2022-02-22 DIAGNOSIS — R0902 Hypoxemia: Secondary | ICD-10-CM | POA: Diagnosis not present

## 2022-02-22 DIAGNOSIS — E860 Dehydration: Secondary | ICD-10-CM | POA: Diagnosis not present

## 2022-02-22 DIAGNOSIS — R410 Disorientation, unspecified: Secondary | ICD-10-CM | POA: Diagnosis not present

## 2022-02-22 DIAGNOSIS — R7401 Elevation of levels of liver transaminase levels: Secondary | ICD-10-CM | POA: Diagnosis not present

## 2022-02-22 DIAGNOSIS — Z888 Allergy status to other drugs, medicaments and biological substances status: Secondary | ICD-10-CM | POA: Diagnosis not present

## 2022-02-22 DIAGNOSIS — Z7401 Bed confinement status: Secondary | ICD-10-CM | POA: Diagnosis not present

## 2022-02-22 DIAGNOSIS — I129 Hypertensive chronic kidney disease with stage 1 through stage 4 chronic kidney disease, or unspecified chronic kidney disease: Secondary | ICD-10-CM | POA: Diagnosis not present

## 2022-02-22 DIAGNOSIS — Z23 Encounter for immunization: Secondary | ICD-10-CM | POA: Diagnosis not present

## 2022-02-22 DIAGNOSIS — N183 Chronic kidney disease, stage 3 unspecified: Secondary | ICD-10-CM | POA: Diagnosis not present

## 2022-02-22 DIAGNOSIS — Z7982 Long term (current) use of aspirin: Secondary | ICD-10-CM | POA: Diagnosis not present

## 2022-02-22 DIAGNOSIS — N3000 Acute cystitis without hematuria: Secondary | ICD-10-CM | POA: Diagnosis not present

## 2022-02-22 DIAGNOSIS — M1990 Unspecified osteoarthritis, unspecified site: Secondary | ICD-10-CM | POA: Diagnosis not present

## 2022-02-22 DIAGNOSIS — N390 Urinary tract infection, site not specified: Secondary | ICD-10-CM | POA: Diagnosis not present

## 2022-03-06 DIAGNOSIS — G9341 Metabolic encephalopathy: Secondary | ICD-10-CM | POA: Diagnosis not present

## 2022-03-06 DIAGNOSIS — R7401 Elevation of levels of liver transaminase levels: Secondary | ICD-10-CM | POA: Diagnosis not present

## 2022-03-06 DIAGNOSIS — Z7689 Persons encountering health services in other specified circumstances: Secondary | ICD-10-CM | POA: Diagnosis not present

## 2022-03-06 DIAGNOSIS — R6520 Severe sepsis without septic shock: Secondary | ICD-10-CM | POA: Diagnosis not present

## 2022-03-06 DIAGNOSIS — Z13220 Encounter for screening for lipoid disorders: Secondary | ICD-10-CM | POA: Diagnosis not present

## 2022-03-06 DIAGNOSIS — R7301 Impaired fasting glucose: Secondary | ICD-10-CM | POA: Diagnosis not present

## 2022-03-06 DIAGNOSIS — I10 Essential (primary) hypertension: Secondary | ICD-10-CM | POA: Diagnosis not present

## 2022-04-10 DIAGNOSIS — R6520 Severe sepsis without septic shock: Secondary | ICD-10-CM | POA: Diagnosis not present

## 2022-04-10 DIAGNOSIS — F02818 Dementia in other diseases classified elsewhere, unspecified severity, with other behavioral disturbance: Secondary | ICD-10-CM | POA: Diagnosis not present

## 2022-04-10 DIAGNOSIS — N179 Acute kidney failure, unspecified: Secondary | ICD-10-CM | POA: Diagnosis not present

## 2022-04-10 DIAGNOSIS — G301 Alzheimer's disease with late onset: Secondary | ICD-10-CM | POA: Diagnosis not present

## 2022-04-10 DIAGNOSIS — N189 Chronic kidney disease, unspecified: Secondary | ICD-10-CM | POA: Diagnosis not present

## 2022-04-10 DIAGNOSIS — N184 Chronic kidney disease, stage 4 (severe): Secondary | ICD-10-CM | POA: Diagnosis not present

## 2022-04-10 DIAGNOSIS — G9341 Metabolic encephalopathy: Secondary | ICD-10-CM | POA: Diagnosis not present

## 2022-04-23 DIAGNOSIS — N184 Chronic kidney disease, stage 4 (severe): Secondary | ICD-10-CM | POA: Diagnosis not present

## 2022-05-09 DIAGNOSIS — N179 Acute kidney failure, unspecified: Secondary | ICD-10-CM | POA: Diagnosis not present

## 2022-05-09 DIAGNOSIS — N390 Urinary tract infection, site not specified: Secondary | ICD-10-CM | POA: Diagnosis not present

## 2022-05-10 DIAGNOSIS — N390 Urinary tract infection, site not specified: Secondary | ICD-10-CM | POA: Diagnosis not present

## 2022-05-10 DIAGNOSIS — Z888 Allergy status to other drugs, medicaments and biological substances status: Secondary | ICD-10-CM | POA: Diagnosis not present

## 2022-05-10 DIAGNOSIS — Z885 Allergy status to narcotic agent status: Secondary | ICD-10-CM | POA: Diagnosis not present

## 2022-05-10 DIAGNOSIS — N3000 Acute cystitis without hematuria: Secondary | ICD-10-CM | POA: Diagnosis not present

## 2022-05-10 DIAGNOSIS — G9341 Metabolic encephalopathy: Secondary | ICD-10-CM | POA: Diagnosis not present

## 2022-05-10 DIAGNOSIS — E7800 Pure hypercholesterolemia, unspecified: Secondary | ICD-10-CM | POA: Diagnosis not present

## 2022-05-10 DIAGNOSIS — Z87440 Personal history of urinary (tract) infections: Secondary | ICD-10-CM | POA: Diagnosis not present

## 2022-05-10 DIAGNOSIS — R5381 Other malaise: Secondary | ICD-10-CM | POA: Diagnosis not present

## 2022-05-10 DIAGNOSIS — Z66 Do not resuscitate: Secondary | ICD-10-CM | POA: Diagnosis not present

## 2022-05-10 DIAGNOSIS — G20 Parkinson's disease: Secondary | ICD-10-CM | POA: Diagnosis not present

## 2022-05-10 DIAGNOSIS — Z7982 Long term (current) use of aspirin: Secondary | ICD-10-CM | POA: Diagnosis not present

## 2022-05-10 DIAGNOSIS — I129 Hypertensive chronic kidney disease with stage 1 through stage 4 chronic kidney disease, or unspecified chronic kidney disease: Secondary | ICD-10-CM | POA: Diagnosis not present

## 2022-05-10 DIAGNOSIS — Z79899 Other long term (current) drug therapy: Secondary | ICD-10-CM | POA: Diagnosis not present

## 2022-05-10 DIAGNOSIS — D631 Anemia in chronic kidney disease: Secondary | ICD-10-CM | POA: Diagnosis not present

## 2022-05-10 DIAGNOSIS — M1990 Unspecified osteoarthritis, unspecified site: Secondary | ICD-10-CM | POA: Diagnosis not present

## 2022-05-10 DIAGNOSIS — N184 Chronic kidney disease, stage 4 (severe): Secondary | ICD-10-CM | POA: Diagnosis not present

## 2022-05-10 DIAGNOSIS — K219 Gastro-esophageal reflux disease without esophagitis: Secondary | ICD-10-CM | POA: Diagnosis not present

## 2022-05-10 DIAGNOSIS — N179 Acute kidney failure, unspecified: Secondary | ICD-10-CM | POA: Diagnosis not present

## 2022-05-21 DIAGNOSIS — N184 Chronic kidney disease, stage 4 (severe): Secondary | ICD-10-CM | POA: Diagnosis not present

## 2022-05-21 DIAGNOSIS — N3000 Acute cystitis without hematuria: Secondary | ICD-10-CM | POA: Diagnosis not present

## 2022-05-25 DIAGNOSIS — N3000 Acute cystitis without hematuria: Secondary | ICD-10-CM | POA: Diagnosis not present

## 2022-05-25 DIAGNOSIS — R319 Hematuria, unspecified: Secondary | ICD-10-CM | POA: Diagnosis not present

## 2022-08-26 DIAGNOSIS — G301 Alzheimer's disease with late onset: Secondary | ICD-10-CM | POA: Diagnosis not present

## 2022-08-26 DIAGNOSIS — K219 Gastro-esophageal reflux disease without esophagitis: Secondary | ICD-10-CM | POA: Diagnosis not present

## 2022-08-26 DIAGNOSIS — F02818 Dementia in other diseases classified elsewhere, unspecified severity, with other behavioral disturbance: Secondary | ICD-10-CM | POA: Diagnosis not present

## 2022-09-28 DIAGNOSIS — N390 Urinary tract infection, site not specified: Secondary | ICD-10-CM | POA: Diagnosis not present

## 2022-09-28 DIAGNOSIS — K219 Gastro-esophageal reflux disease without esophagitis: Secondary | ICD-10-CM | POA: Diagnosis not present

## 2022-09-28 DIAGNOSIS — N179 Acute kidney failure, unspecified: Secondary | ICD-10-CM | POA: Diagnosis not present

## 2022-09-28 DIAGNOSIS — Z87440 Personal history of urinary (tract) infections: Secondary | ICD-10-CM | POA: Diagnosis not present

## 2022-09-28 DIAGNOSIS — Z66 Do not resuscitate: Secondary | ICD-10-CM | POA: Diagnosis not present

## 2022-09-28 DIAGNOSIS — E860 Dehydration: Secondary | ICD-10-CM | POA: Diagnosis not present

## 2022-09-28 DIAGNOSIS — G9341 Metabolic encephalopathy: Secondary | ICD-10-CM | POA: Diagnosis not present

## 2022-09-28 DIAGNOSIS — Z7401 Bed confinement status: Secondary | ICD-10-CM | POA: Diagnosis not present

## 2022-09-28 DIAGNOSIS — E7800 Pure hypercholesterolemia, unspecified: Secondary | ICD-10-CM | POA: Diagnosis not present

## 2022-09-28 DIAGNOSIS — M1990 Unspecified osteoarthritis, unspecified site: Secondary | ICD-10-CM | POA: Diagnosis not present

## 2022-09-28 DIAGNOSIS — G20A1 Parkinson's disease without dyskinesia, without mention of fluctuations: Secondary | ICD-10-CM | POA: Diagnosis not present

## 2022-09-28 DIAGNOSIS — F02C0 Dementia in other diseases classified elsewhere, severe, without behavioral disturbance, psychotic disturbance, mood disturbance, and anxiety: Secondary | ICD-10-CM | POA: Diagnosis not present

## 2022-09-28 DIAGNOSIS — Z7982 Long term (current) use of aspirin: Secondary | ICD-10-CM | POA: Diagnosis not present

## 2022-09-28 DIAGNOSIS — Z79899 Other long term (current) drug therapy: Secondary | ICD-10-CM | POA: Diagnosis not present

## 2022-09-28 DIAGNOSIS — Z885 Allergy status to narcotic agent status: Secondary | ICD-10-CM | POA: Diagnosis not present

## 2022-09-28 DIAGNOSIS — Z881 Allergy status to other antibiotic agents status: Secondary | ICD-10-CM | POA: Diagnosis not present

## 2022-09-28 DIAGNOSIS — N183 Chronic kidney disease, stage 3 unspecified: Secondary | ICD-10-CM | POA: Diagnosis not present

## 2022-09-28 DIAGNOSIS — D631 Anemia in chronic kidney disease: Secondary | ICD-10-CM | POA: Diagnosis not present

## 2022-09-28 DIAGNOSIS — I129 Hypertensive chronic kidney disease with stage 1 through stage 4 chronic kidney disease, or unspecified chronic kidney disease: Secondary | ICD-10-CM | POA: Diagnosis not present

## 2022-09-28 DIAGNOSIS — R531 Weakness: Secondary | ICD-10-CM | POA: Diagnosis not present

## 2022-09-29 DIAGNOSIS — N390 Urinary tract infection, site not specified: Secondary | ICD-10-CM | POA: Diagnosis not present

## 2022-09-29 DIAGNOSIS — N179 Acute kidney failure, unspecified: Secondary | ICD-10-CM | POA: Diagnosis not present

## 2022-09-29 DIAGNOSIS — G9341 Metabolic encephalopathy: Secondary | ICD-10-CM | POA: Diagnosis not present

## 2022-09-30 DIAGNOSIS — N179 Acute kidney failure, unspecified: Secondary | ICD-10-CM | POA: Diagnosis not present

## 2022-09-30 DIAGNOSIS — N390 Urinary tract infection, site not specified: Secondary | ICD-10-CM | POA: Diagnosis not present

## 2022-09-30 DIAGNOSIS — G9341 Metabolic encephalopathy: Secondary | ICD-10-CM | POA: Diagnosis not present

## 2022-10-01 DIAGNOSIS — N179 Acute kidney failure, unspecified: Secondary | ICD-10-CM | POA: Diagnosis not present

## 2022-10-01 DIAGNOSIS — G9341 Metabolic encephalopathy: Secondary | ICD-10-CM | POA: Diagnosis not present

## 2022-10-01 DIAGNOSIS — N390 Urinary tract infection, site not specified: Secondary | ICD-10-CM | POA: Diagnosis not present

## 2022-10-05 DIAGNOSIS — F02818 Dementia in other diseases classified elsewhere, unspecified severity, with other behavioral disturbance: Secondary | ICD-10-CM | POA: Diagnosis not present

## 2022-10-05 DIAGNOSIS — G301 Alzheimer's disease with late onset: Secondary | ICD-10-CM | POA: Diagnosis not present

## 2022-10-05 DIAGNOSIS — K219 Gastro-esophageal reflux disease without esophagitis: Secondary | ICD-10-CM | POA: Diagnosis not present

## 2022-10-05 DIAGNOSIS — N390 Urinary tract infection, site not specified: Secondary | ICD-10-CM | POA: Diagnosis not present

## 2022-10-05 DIAGNOSIS — N1832 Chronic kidney disease, stage 3b: Secondary | ICD-10-CM | POA: Diagnosis not present

## 2022-10-05 DIAGNOSIS — Z7689 Persons encountering health services in other specified circumstances: Secondary | ICD-10-CM | POA: Diagnosis not present

## 2022-11-17 DIAGNOSIS — Z13220 Encounter for screening for lipoid disorders: Secondary | ICD-10-CM | POA: Diagnosis not present

## 2022-11-17 DIAGNOSIS — R634 Abnormal weight loss: Secondary | ICD-10-CM | POA: Diagnosis not present

## 2022-11-17 DIAGNOSIS — R7302 Impaired glucose tolerance (oral): Secondary | ICD-10-CM | POA: Diagnosis not present

## 2022-11-17 DIAGNOSIS — F02818 Dementia in other diseases classified elsewhere, unspecified severity, with other behavioral disturbance: Secondary | ICD-10-CM | POA: Diagnosis not present

## 2022-11-17 DIAGNOSIS — K219 Gastro-esophageal reflux disease without esophagitis: Secondary | ICD-10-CM | POA: Diagnosis not present

## 2022-11-17 DIAGNOSIS — G301 Alzheimer's disease with late onset: Secondary | ICD-10-CM | POA: Diagnosis not present

## 2023-12-18 IMAGING — DX DG HAND COMPLETE 3+V*L*
3 series · 3 of 3 positions shown · non-contrast
Comparison: None.

CLINICAL DATA: fall

EXAM:
LEFT HAND - COMPLETE 3+ VIEW

[x hand pa left]
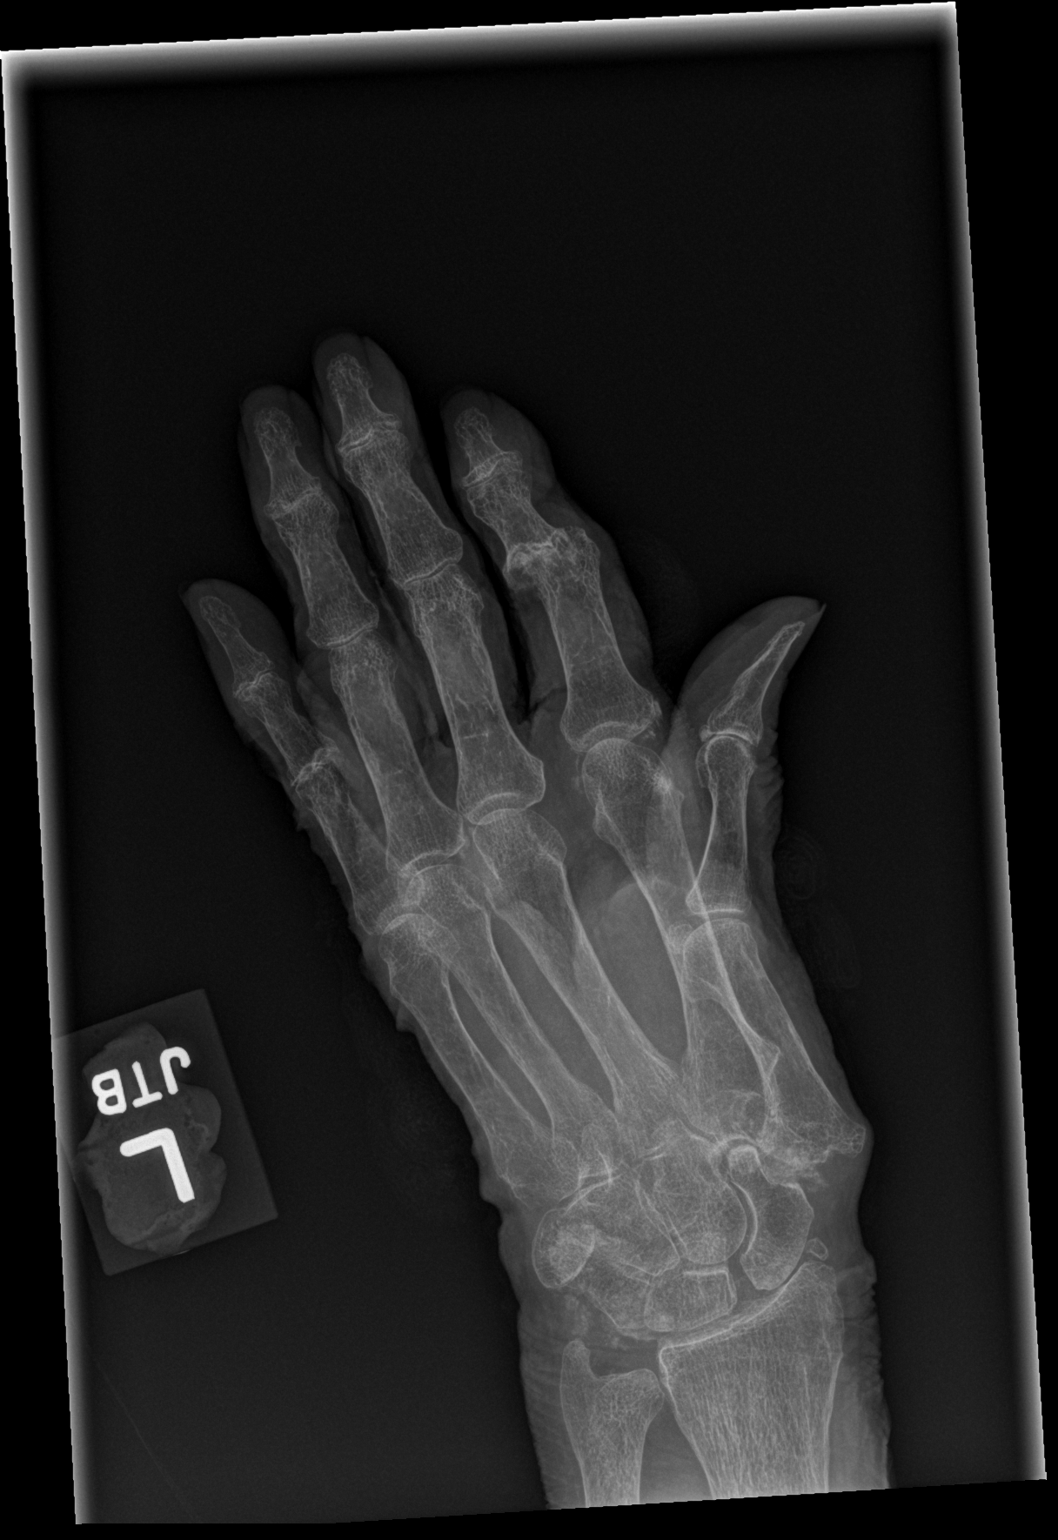

[x hand obl left]
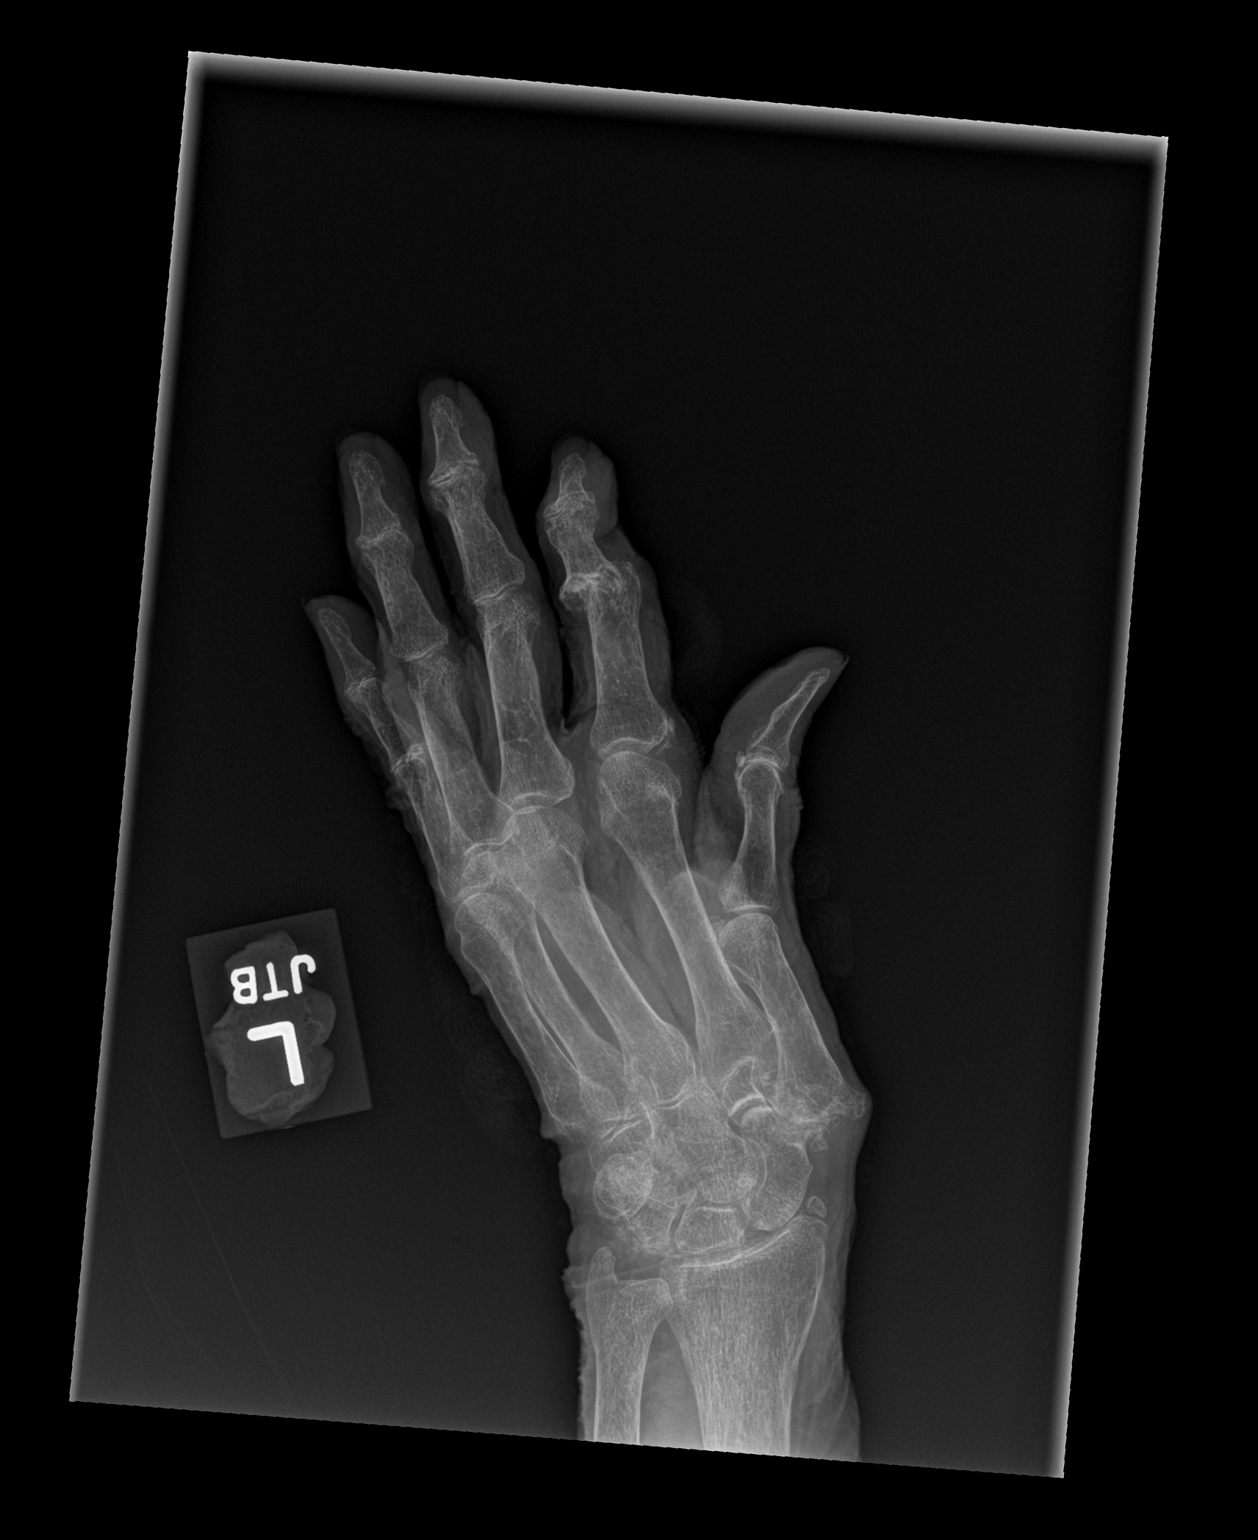

[x hand lat left]
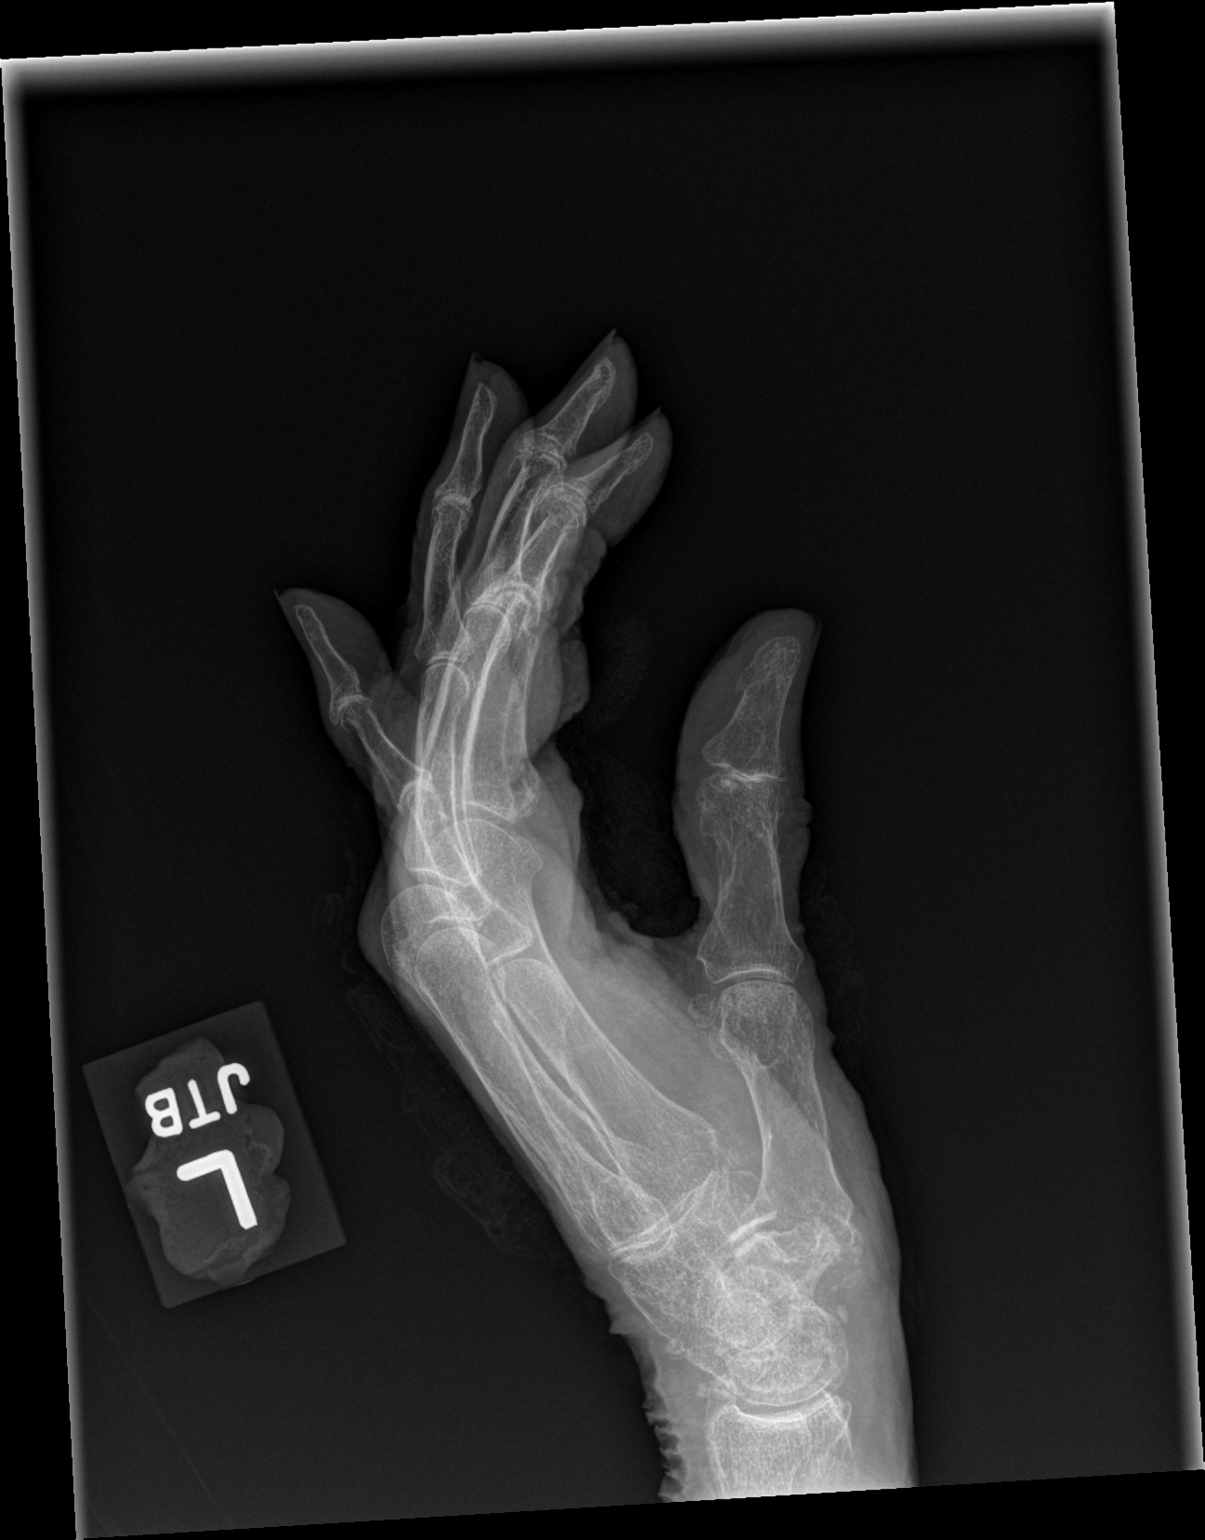

[3 of 3 positions shown; findings below may reference images not displayed]

FINDINGS: Osteopenia. No definitive acute fracture or dislocation. Chronic
appearing bone-on-bone apposition of the second PIP with ulnar
subluxation of the distal finger; this could reflect sequela of
inflammatory arthritis . Advanced degenerative changes of the first
[facility]. Chondrocalcinosis. Well corticated ossific density adjacent to
the radial head consistent with sequela of remote prior trauma.
Widening of the scapholunate space likely reflecting sequela of
remote prior trauma or inflammatory arthropathy. Degenerative
changes of the DIPs.
IMPRESSION: 1. No definitive acute fracture or dislocation.
2. Widening of the scapholunate interval is likely chronic in
nature.
3. Osseous changes suggesting underlying inflammatory arthropathy.

If persistent clinical concern for scaphoid fracture, recommend
immobilization and follow-up radiographs in 2 weeks versus MRI.

## 2023-12-18 IMAGING — DX DG FOREARM 2V*R*
3 series · 3 of 3 positions shown · non-contrast
Comparison: None.

CLINICAL DATA: fall

EXAM:
RIGHT FOREARM - 2 VIEW; RIGHT HAND - COMPLETE 3+ VIEW

[x forearm ap right]
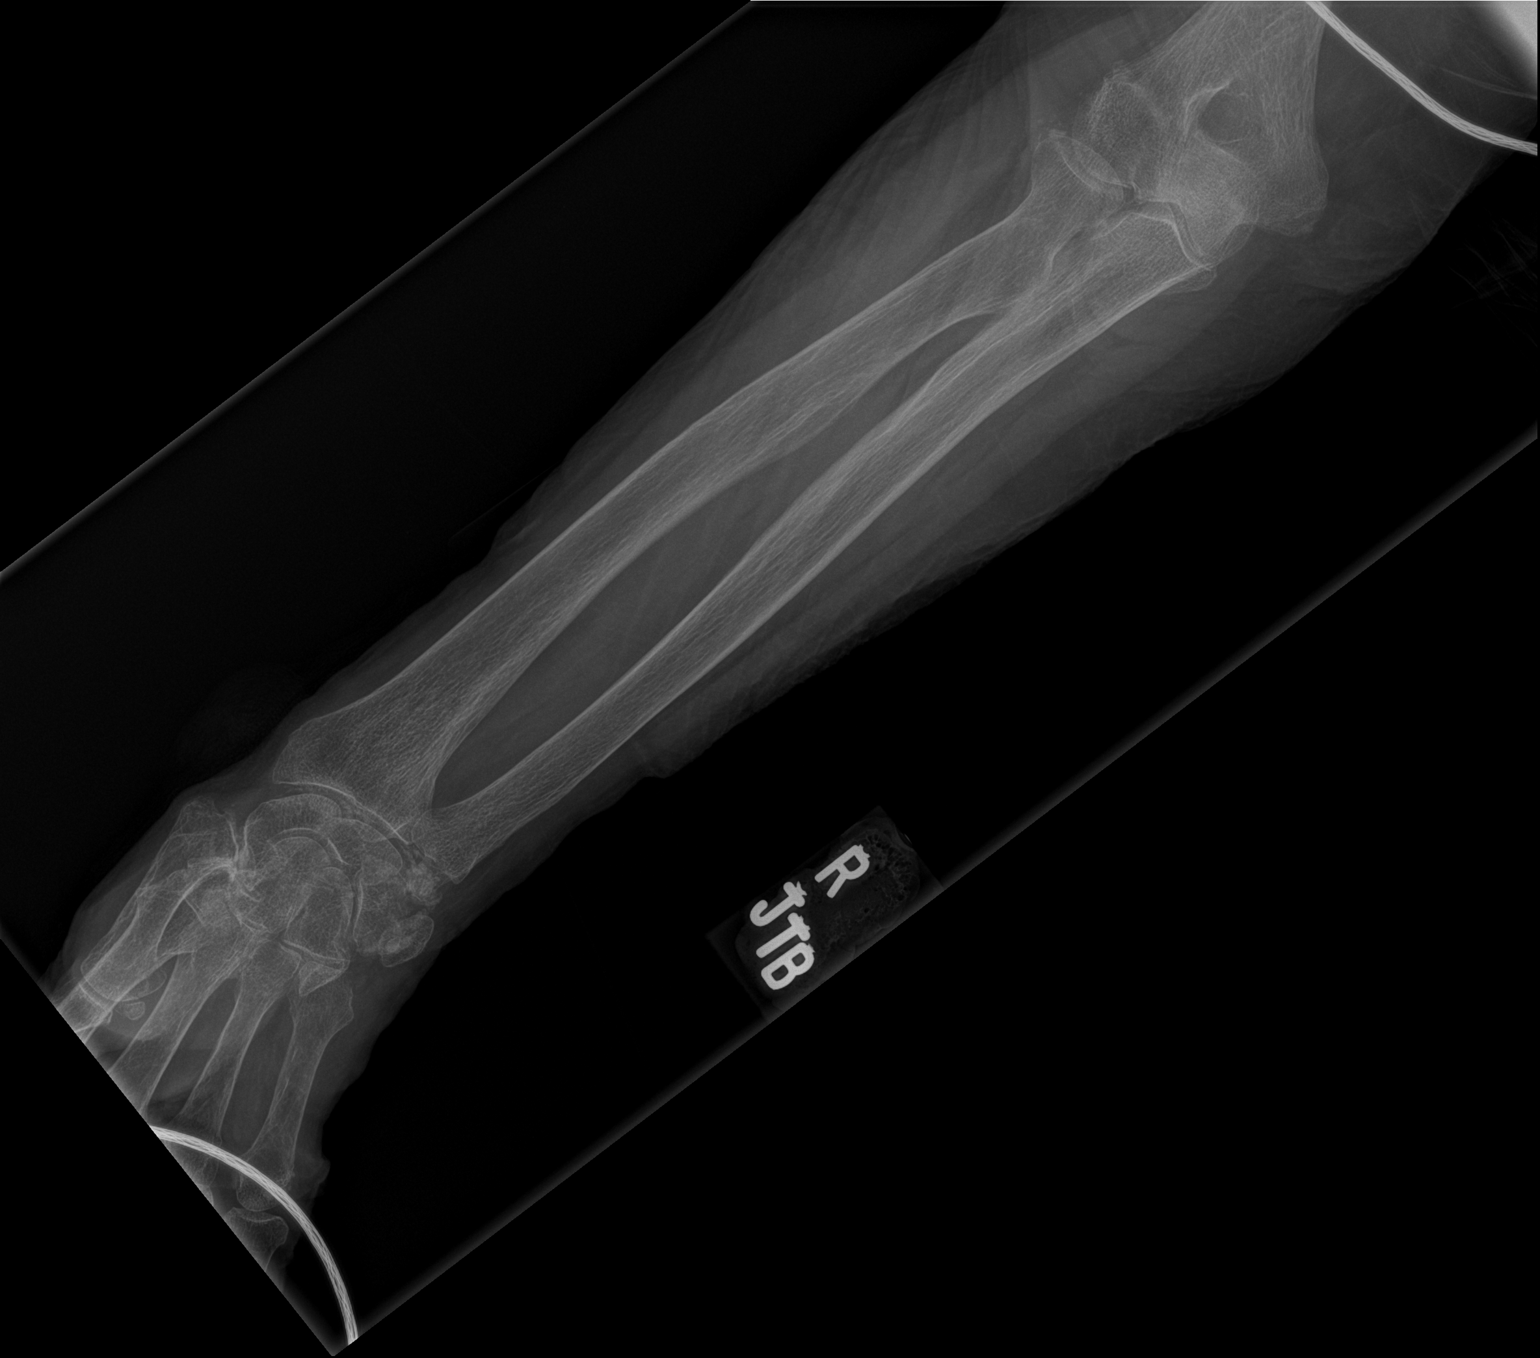

[x forearm lat right (1 of 2)]
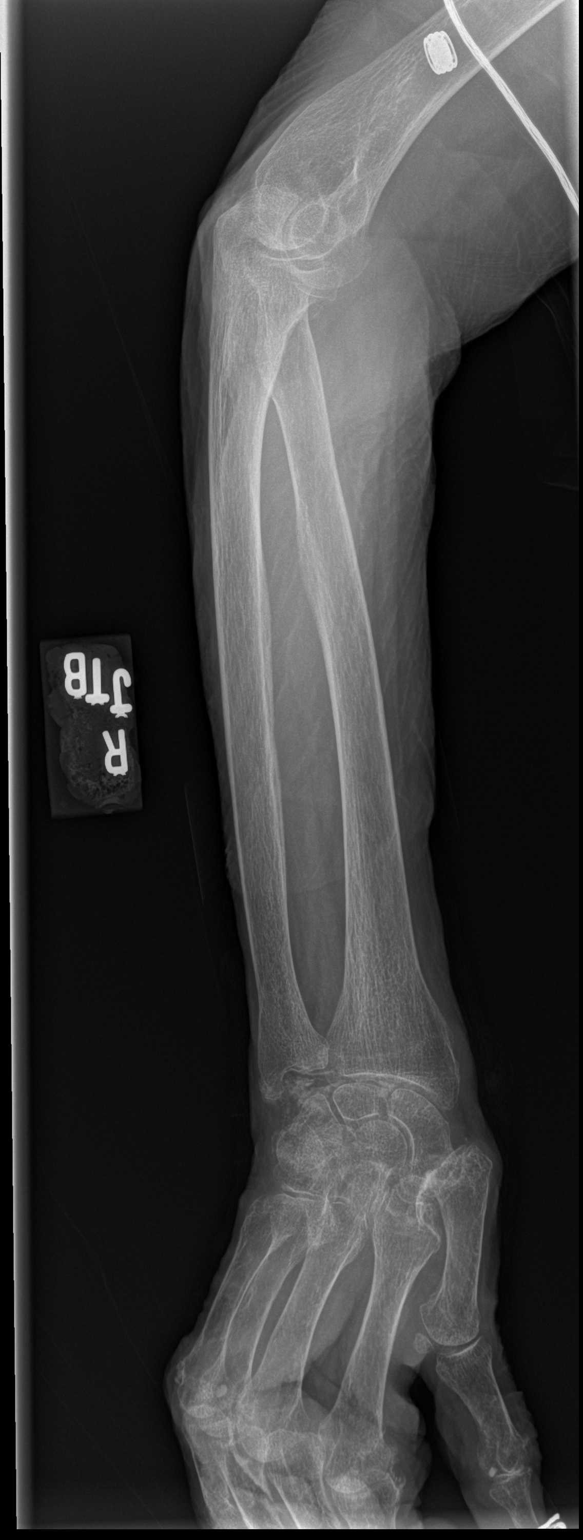

[x forearm lat right (2 of 2)]
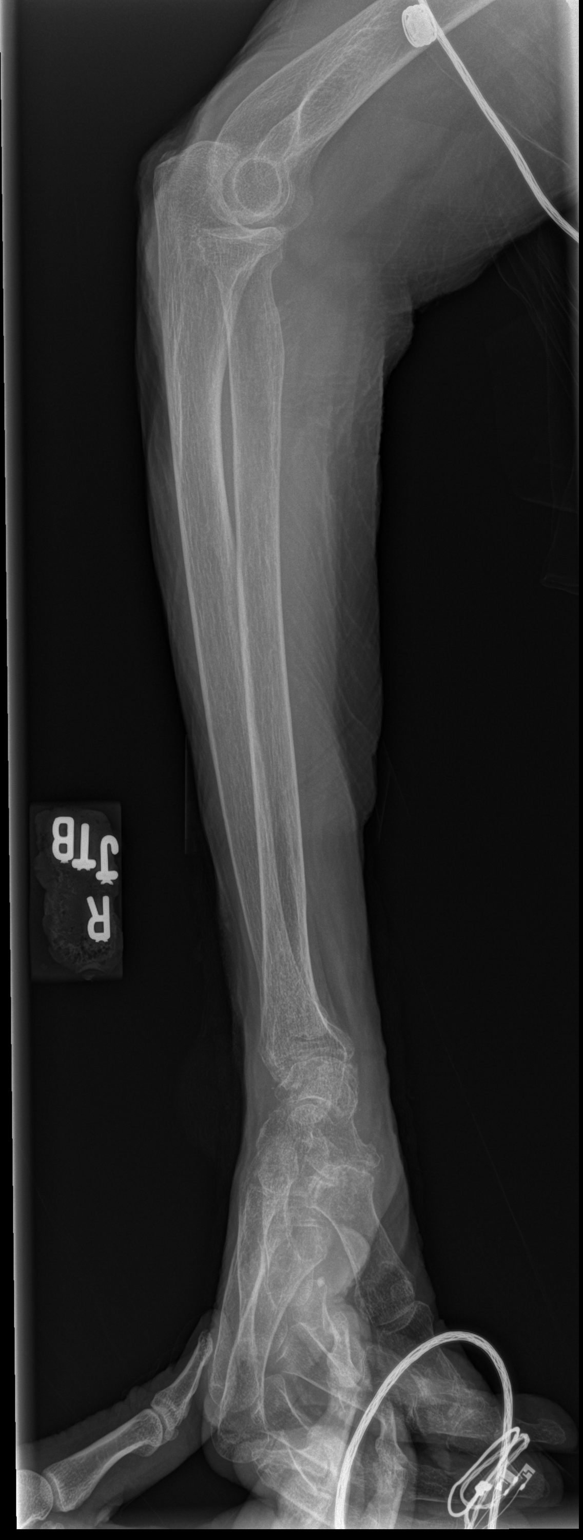

[3 of 3 positions shown; findings below may reference images not displayed]

FINDINGS: Osteopenia. No definitive acute fracture or dislocation. Advanced
degenerative changes of the first CMC with extensive osseous
remodeling. Degenerative changes of the DIPs. Chondrocalcinosis.
Degenerative changes of the radiocarpal joint. No unexpected
radiopaque foreign body.
IMPRESSION: 1. No definitive acute fracture or dislocation of the RIGHT hand or
forearm. If persistent concern, recommend immobilization with repeat
imaging in 2 weeks.

If persistent clinical concern for scaphoid fracture, recommend
immobilization and follow-up radiographs in [address] or
MRI.

## 2023-12-18 IMAGING — DX DG PELVIS 1-2V
2 series · 2 of 2 positions shown · non-contrast
Comparison: None.

CLINICAL DATA: fall

EXAM:
PELVIS - 1-2 VIEW

[x pelvis (1 of 2)]
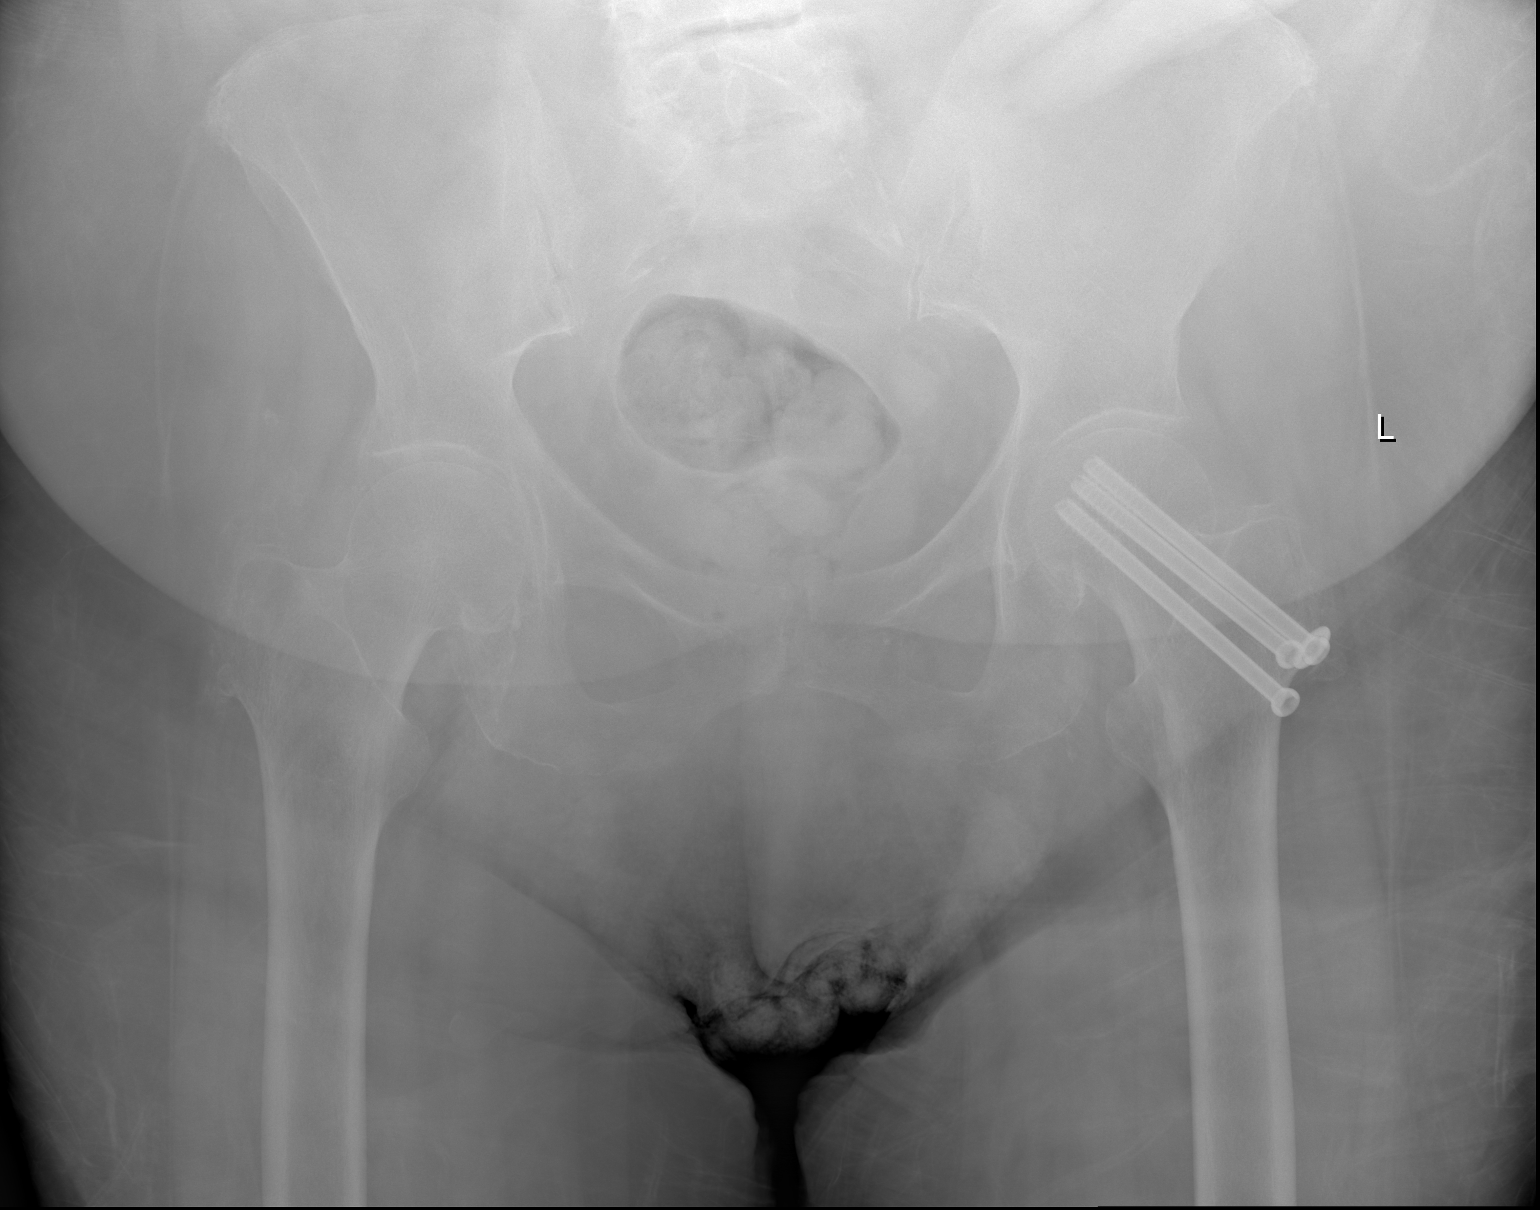

[x pelvis (2 of 2)]
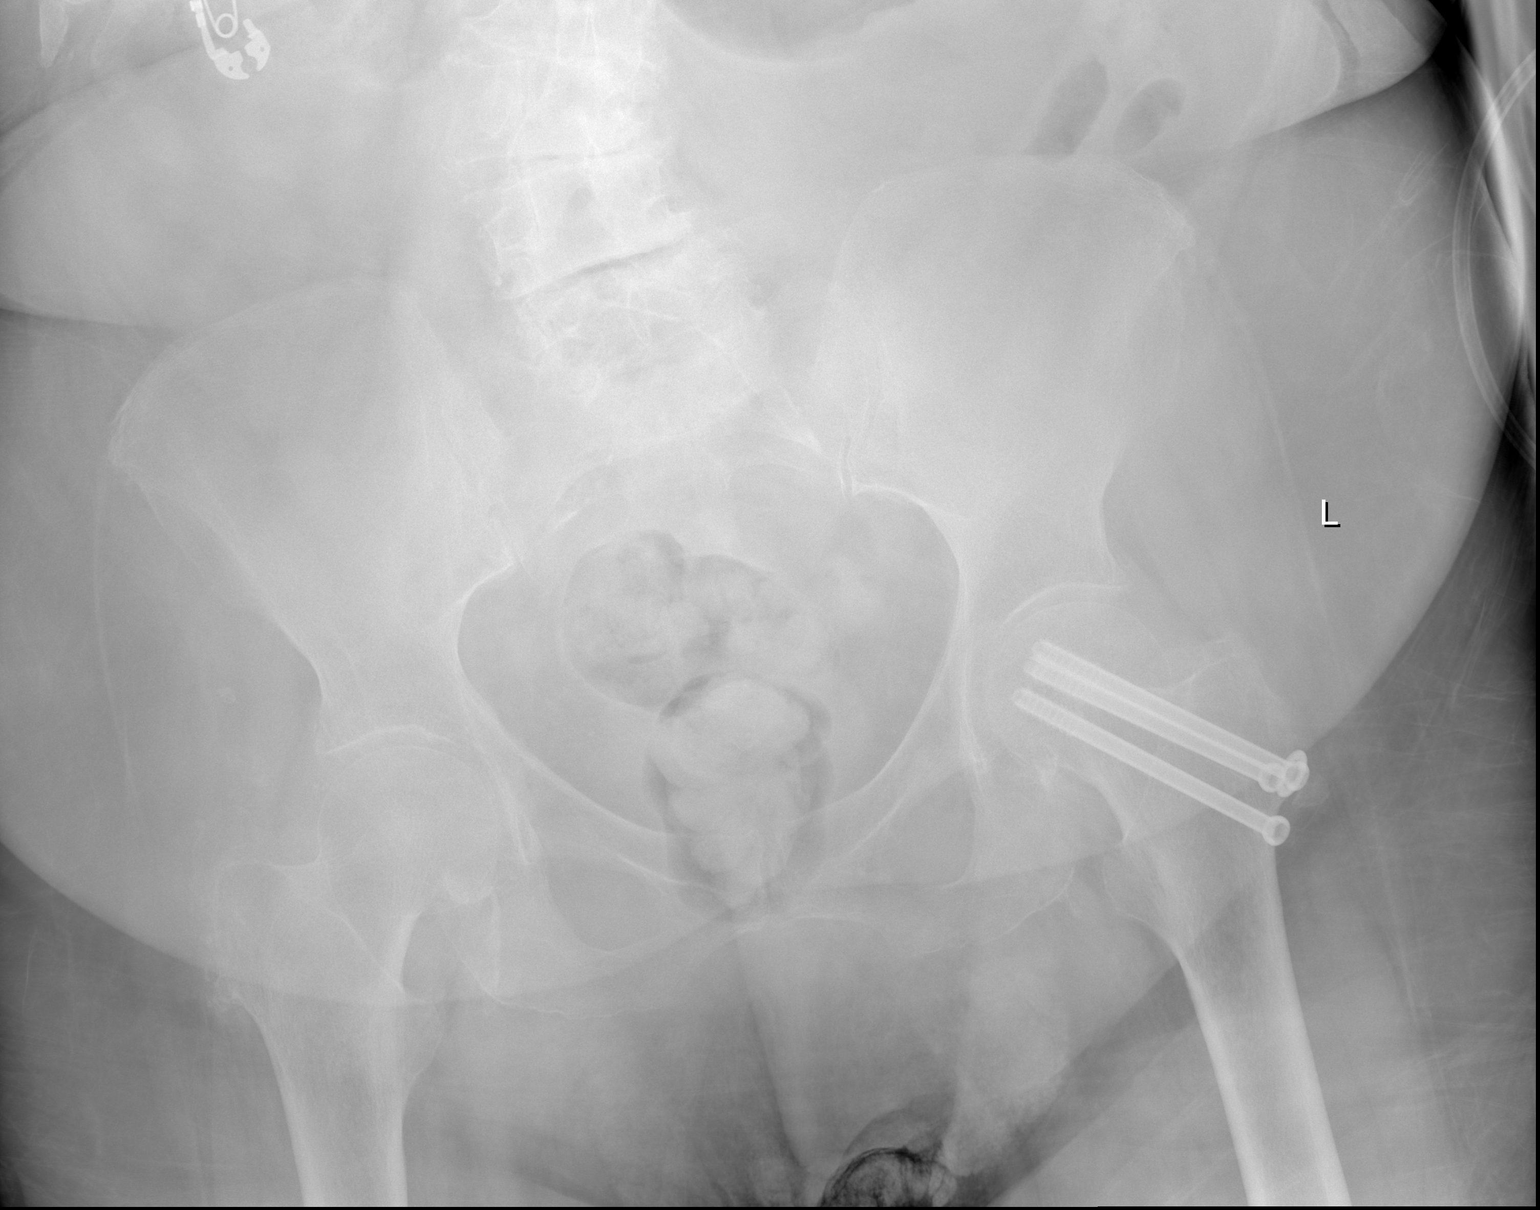

[2 of 2 positions shown; findings below may reference images not displayed]

FINDINGS: Evaluation is limited by underpenetration. Status post pinning of
the LEFT femur. No pelvic diastasis. No definitive acute fracture or
dislocation within this limited exam. Degenerative changes of the
lower lumbar spine. Limited assessment of the sacrum secondary to
overlapping bowel contents.
IMPRESSION: No definitive acute fracture or dislocation. If persistent concern,
recommend additional views or cross-sectional imaging.

## 2023-12-18 IMAGING — DX DG HAND COMPLETE 3+V*R*
3 series · 3 of 3 positions shown · non-contrast
Comparison: None.

CLINICAL DATA: fall

EXAM:
RIGHT FOREARM - 2 VIEW; RIGHT HAND - COMPLETE 3+ VIEW

[x hand pa right]
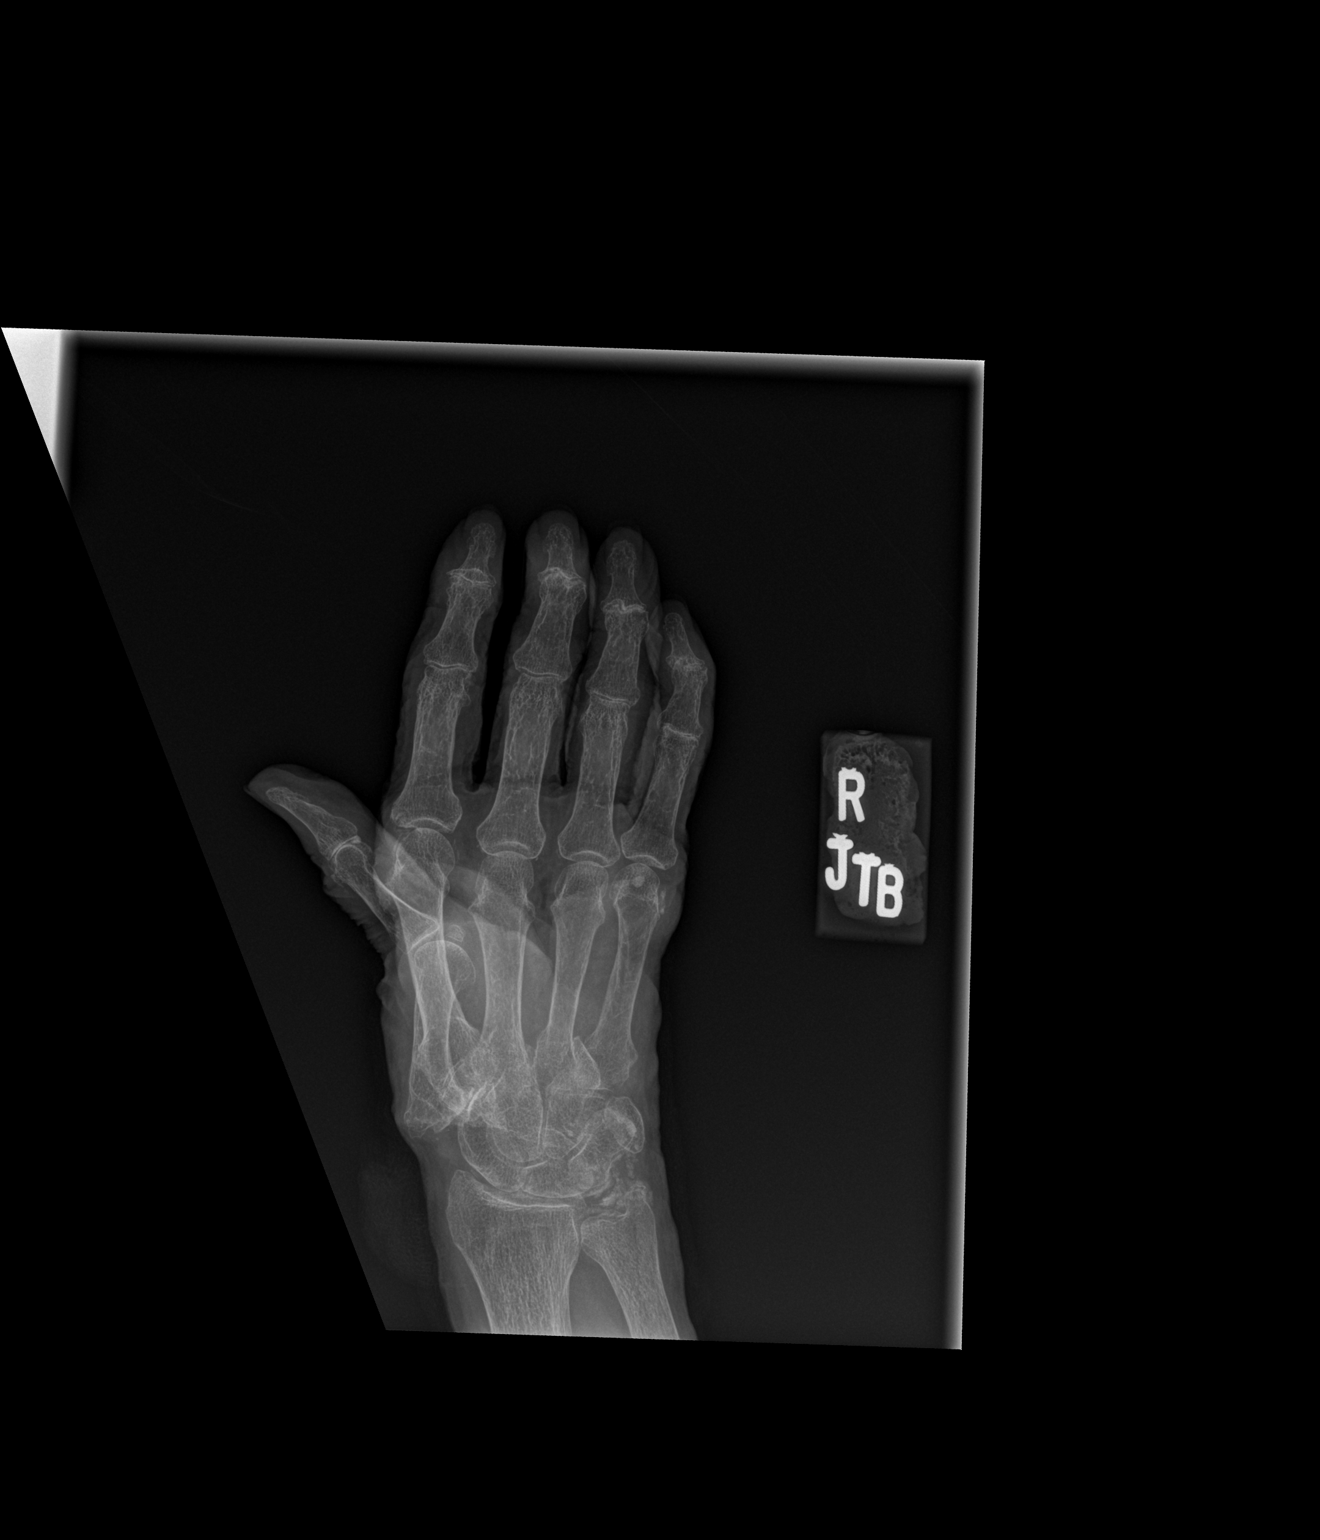

[x hand obl right]
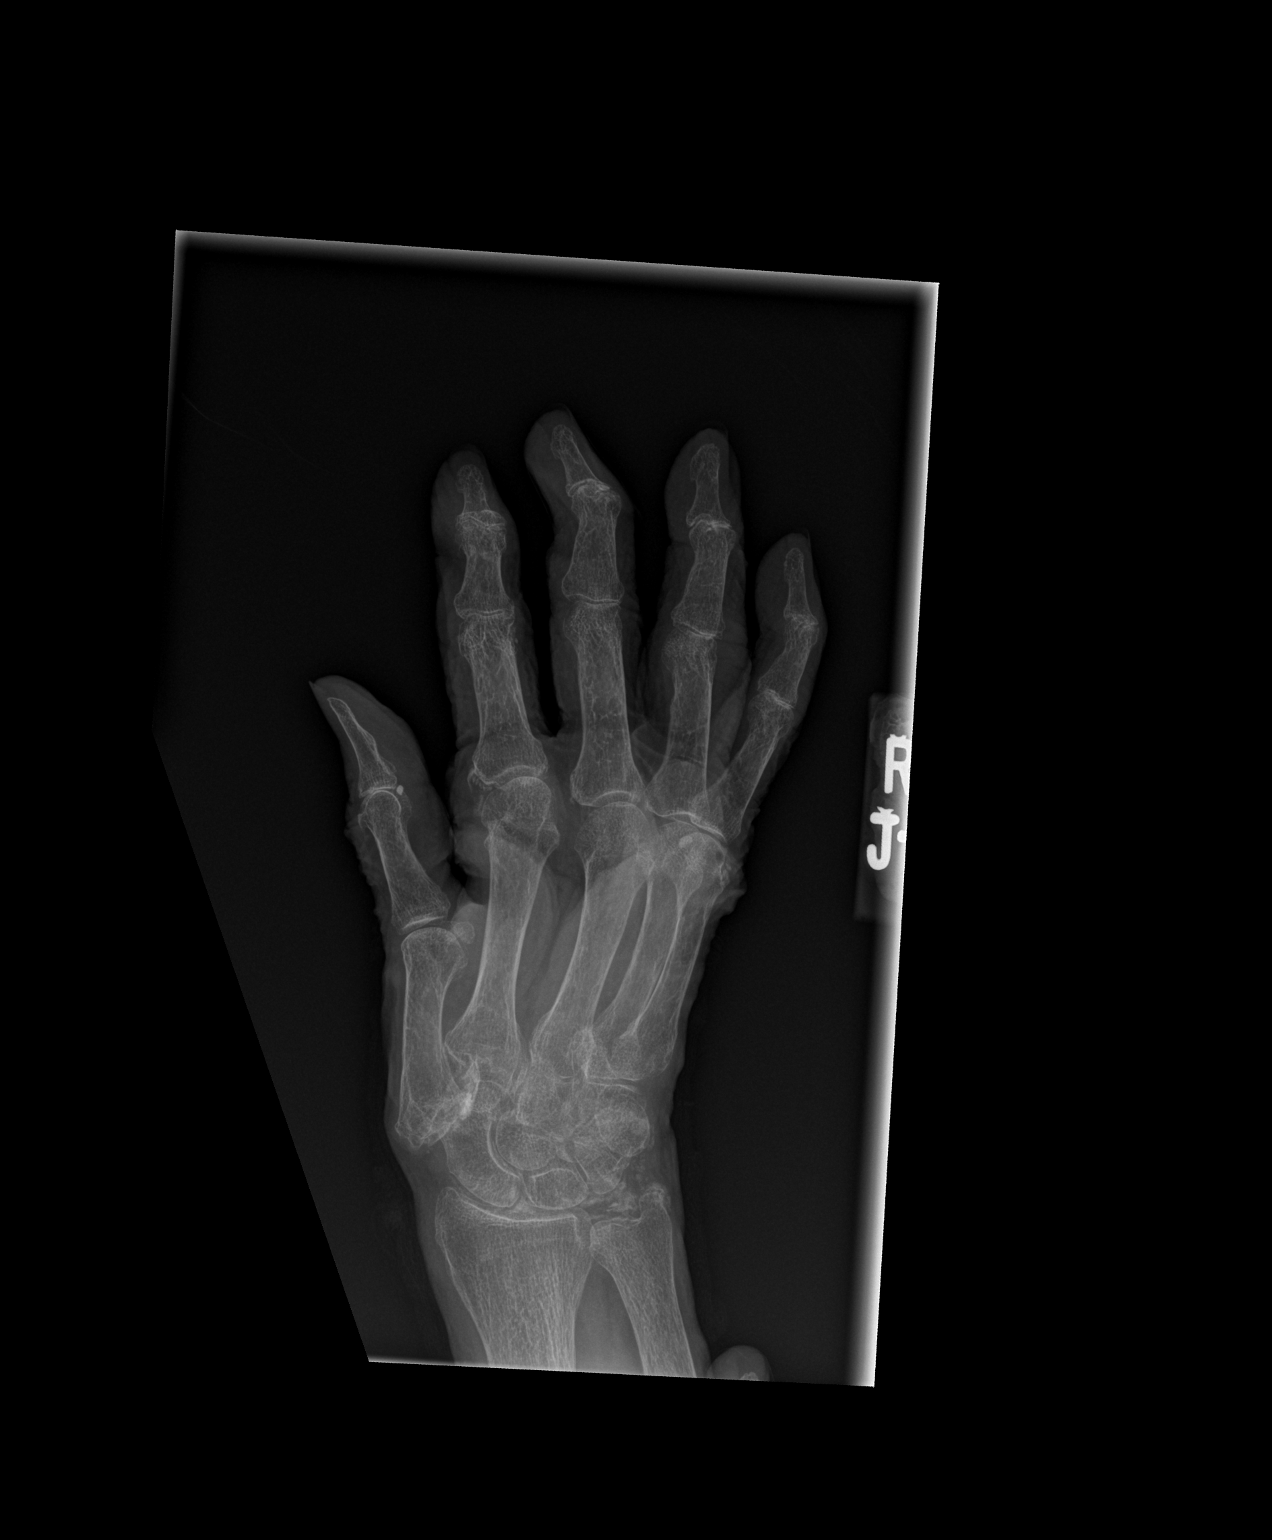

[x hand lat right]
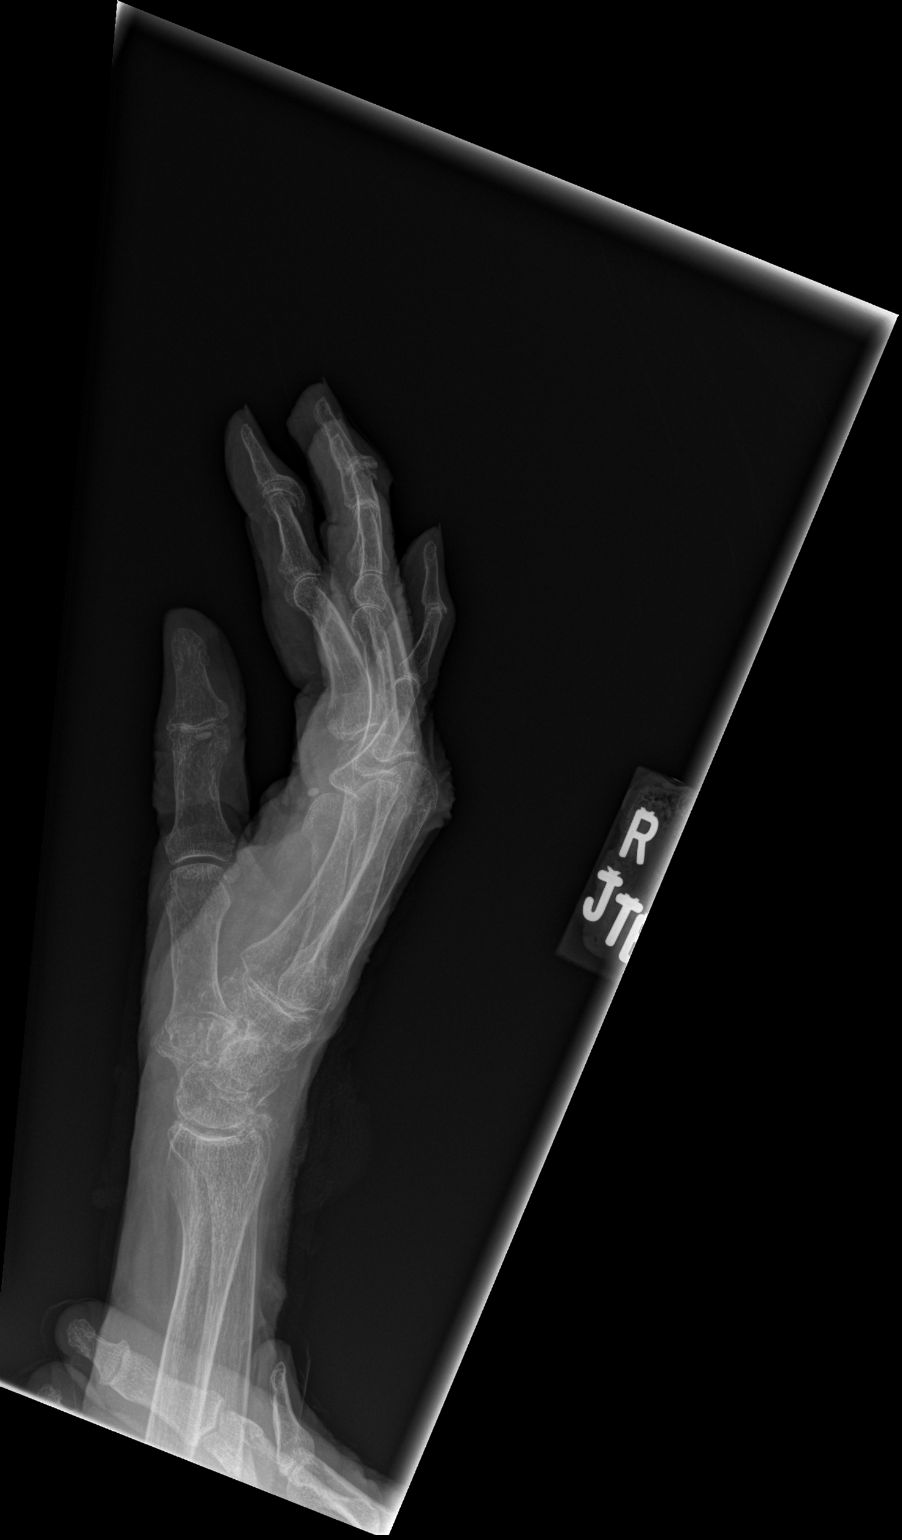

[3 of 3 positions shown; findings below may reference images not displayed]

FINDINGS: Osteopenia. No definitive acute fracture or dislocation. Advanced
degenerative changes of the first CMC with extensive osseous
remodeling. Degenerative changes of the DIPs. Chondrocalcinosis.
Degenerative changes of the radiocarpal joint. No unexpected
radiopaque foreign body.
IMPRESSION: 1. No definitive acute fracture or dislocation of the RIGHT hand or
forearm. If persistent concern, recommend immobilization with repeat
imaging in 2 weeks.

If persistent clinical concern for scaphoid fracture, recommend
immobilization and follow-up radiographs in 2 weeks [address] or
MRI.

## 2024-05-04 DIAGNOSIS — D2239 Melanocytic nevi of other parts of face: Secondary | ICD-10-CM | POA: Diagnosis not present

## 2024-05-04 DIAGNOSIS — L821 Other seborrheic keratosis: Secondary | ICD-10-CM | POA: Diagnosis not present

## 2024-05-04 DIAGNOSIS — L720 Epidermal cyst: Secondary | ICD-10-CM | POA: Diagnosis not present

## 2024-06-19 DIAGNOSIS — G20A1 Parkinson's disease without dyskinesia, without mention of fluctuations: Secondary | ICD-10-CM | POA: Diagnosis not present

## 2024-06-19 DIAGNOSIS — L723 Sebaceous cyst: Secondary | ICD-10-CM | POA: Diagnosis not present

## 2024-06-19 DIAGNOSIS — Z79899 Other long term (current) drug therapy: Secondary | ICD-10-CM | POA: Diagnosis not present
